# Patient Record
Sex: Female | Born: 1960 | Race: White | Hispanic: Yes | Smoking: Former smoker
Health system: Southern US, Community
[De-identification: ages and names within clinical notes are randomized; demographics above are authoritative.]

## PROBLEM LIST (undated history)

## (undated) DIAGNOSIS — C50919 Malignant neoplasm of unspecified site of unspecified female breast: Secondary | ICD-10-CM

## (undated) DIAGNOSIS — I10 Essential (primary) hypertension: Secondary | ICD-10-CM

## (undated) DIAGNOSIS — K219 Gastro-esophageal reflux disease without esophagitis: Secondary | ICD-10-CM

## (undated) DIAGNOSIS — E119 Type 2 diabetes mellitus without complications: Secondary | ICD-10-CM

## (undated) DIAGNOSIS — R32 Unspecified urinary incontinence: Secondary | ICD-10-CM

## (undated) HISTORY — DX: Gastro-esophageal reflux disease without esophagitis: K21.9

## (undated) HISTORY — PX: EYE SURGERY: SHX253

## (undated) HISTORY — DX: Unspecified urinary incontinence: R32

## (undated) HISTORY — PX: TUBAL LIGATION: SHX77

---

## 2016-01-06 ENCOUNTER — Ambulatory Visit: Payer: Self-pay | Admitting: Family Medicine

## 2016-02-07 ENCOUNTER — Ambulatory Visit (INDEPENDENT_AMBULATORY_CARE_PROVIDER_SITE_OTHER): Payer: BLUE CROSS/BLUE SHIELD | Admitting: Family Medicine

## 2016-02-07 ENCOUNTER — Encounter: Payer: Self-pay | Admitting: Family Medicine

## 2016-02-07 VITALS — BP 137/80 | HR 75 | Temp 98.8°F | Resp 16 | Ht 64.3 in | Wt 218.0 lb

## 2016-02-07 DIAGNOSIS — N950 Postmenopausal bleeding: Secondary | ICD-10-CM | POA: Insufficient documentation

## 2016-02-07 DIAGNOSIS — R32 Unspecified urinary incontinence: Secondary | ICD-10-CM | POA: Diagnosis not present

## 2016-02-07 DIAGNOSIS — Z1211 Encounter for screening for malignant neoplasm of colon: Secondary | ICD-10-CM | POA: Diagnosis not present

## 2016-02-07 DIAGNOSIS — Z1239 Encounter for other screening for malignant neoplasm of breast: Secondary | ICD-10-CM | POA: Diagnosis not present

## 2016-02-07 DIAGNOSIS — K219 Gastro-esophageal reflux disease without esophagitis: Secondary | ICD-10-CM | POA: Diagnosis not present

## 2016-02-07 DIAGNOSIS — Z0000 Encounter for general adult medical examination without abnormal findings: Secondary | ICD-10-CM | POA: Diagnosis not present

## 2016-02-07 LAB — FSH/LH
FSH: 67.8 m[IU]/mL
LH: 30.1 m[IU]/mL

## 2016-02-07 LAB — TSH: TSH: 1.48 mIU/L

## 2016-02-07 MED ORDER — OMEPRAZOLE 40 MG PO CPDR: 40.0000 mg | DELAYED_RELEASE_CAPSULE | Freq: Every day | ORAL | Status: DC

## 2016-02-07 NOTE — Progress Notes (Signed)
Subjective:    Patient ID: Angela Quinn, female    DOB: [birth year], 55 y.o.   MRN: [id number]  HPI: Angela Quinn is a 55 y.o. female presenting on 02/07/2016 for Establish Care   HPI  Pt presents to establish care and for wellness visit today. Previous care provider was [facility]:[address]   It has been years since4  Her last PCP visit. Records from previous provider will be requested and reviewed. Current medical problems include:  GERD: Takes omeprazole to control symptoms.  Irregular menstrual periods: Thought she was post-menopausal and it started again. Periods stopped about 1 year in 2014, restarted in 2015- stopped in September 2015 and had a cycle on Dec 16, 2015. Cycle lasted 6-8 days. Normal cycle for her. Small clots, no heavy bleeding. Was worked up by previous MD. She had a few tests- with no answer. No abdominal fullness. No vagainal bleeding inbetween. Mild hot flashes.  Stress incontinence: Leaks when she sneezes, coughes. Wears a poise pad. Drinks lots of water. Had 4 previous vaginal deliveries. No prolapse. Only in town on Friday.   Health maintenance:  Last pap smear 4 years ago.  Works as a Air traffic controller.  Colonoscopy: Does not want. Would like to do cologuard.  Needs mammograms-    Past Medical History  Diagnosis Date  . GERD (gastroesophageal reflux disease)   . Urine incontinence    Social History   Social History  . Marital Status: Divorced    Spouse Name: N/A  . Number of Children: N/A  . Years of Education: N/A   Occupational History  . Not on file.   Social History Main Topics  . Smoking status: Former Smoker -- 1.00 packs/day for 20 years    Types: Cigarettes  . Smokeless tobacco: Never Used  . Alcohol Use: No  . Drug Use: No  . Sexual Activity: Not on file   Other Topics Concern  . Not on file   Social History Narrative  . No narrative on file   Family History  Problem Relation Age of Onset  .  Hypertension Mother   . Diabetes Mother   . Arthritis Mother   . Cancer Father     brain tumor   No current outpatient prescriptions on file prior to visit.   No current facility-administered medications on file prior to visit.    Review of Systems  Constitutional: Negative for fever and chills.  HENT: Negative.   Respiratory: Negative for cough, chest tightness and wheezing.   Cardiovascular: Negative for chest pain and leg swelling.  Gastrointestinal: Negative for nausea, vomiting, abdominal pain, diarrhea and constipation.  Endocrine: Negative.  Negative for cold intolerance, heat intolerance, polydipsia, polyphagia and polyuria.  Genitourinary: Negative for dysuria and difficulty urinating.  Musculoskeletal: Negative.   Neurological: Negative for dizziness, light-headedness and numbness.  Psychiatric/Behavioral: Negative.    Per HPI unless specifically indicated above     Objective:    BP 137/80 mmHg  Pulse 75  Temp(Src) 98.8 F (37.1 C) (Oral)  Resp 16  Ht 5' 4.3" (1.633 m)  Wt 218 lb (98.884 kg)  BMI 37.08 kg/m2  Wt Readings from Last 3 Encounters:  02/07/16 218 lb (98.884 kg)    Physical Exam  Constitutional: She is oriented to person, place, and time. She appears well-developed and well-nourished.  HENT:  Head: Normocephalic and atraumatic.  Neck: Neck supple.  Cardiovascular: Normal rate, regular rhythm and normal heart sounds.  Exam reveals no gallop and  no friction rub.   No murmur heard. Pulmonary/Chest: Effort normal and breath sounds normal. She has no wheezes. She exhibits no tenderness. Right breast exhibits no inverted nipple, no mass, no nipple discharge, no skin change and no tenderness. Left breast exhibits no inverted nipple, no mass, no nipple discharge, no skin change and no tenderness. Breasts are symmetrical.  Abdominal: Soft. Normal appearance and bowel sounds are normal. She exhibits no distension and no mass. There is no tenderness. There is  no rebound and no guarding.  Genitourinary: No breast swelling, tenderness, discharge or bleeding.  Musculoskeletal: Normal range of motion. She exhibits no edema or tenderness.  Lymphadenopathy:    She has no cervical adenopathy.  Neurological: She is alert and oriented to person, place, and time.  Skin: Skin is warm and dry.   No results found for this or any previous visit.    Assessment & Plan:   Problem List Items Addressed This Visit      Digestive   GERD (gastroesophageal reflux disease)    Renewed omeprazole.       Relevant Medications   omeprazole (PRILOSEC OTC) 20 MG tablet   omeprazole (PRILOSEC) 40 MG capsule   Other Relevant Orders   COMPLETE METABOLIC PANEL WITH GFR     Other   Urine incontinence    Stress incontinence. Kegel exercise and refer to pelvic floor PT.       Relevant Orders   Ambulatory referral to Physical Therapy   Post-menopausal bleeding    Refer to GYN for work-up and possible endometrial biopsy. Check FSH/LH to confirm menopause. Check TSH.      Relevant Orders   FSH/LH   TSH   Ambulatory referral to Obstetrics / Gynecology    Other Visit Diagnoses    Preventative health care    -  Primary    Relevant Orders    VITAMIN D 25 Hydroxy (Vit-D Deficiency, Fractures)    Lipid Profile    Breast cancer screening        Screening for colon cancer           Meds ordered this encounter  Medications  . omeprazole (PRILOSEC OTC) 20 MG tablet    Sig: Take 20 mg by mouth daily.  Marland Kitchen omeprazole (PRILOSEC) 40 MG capsule    Sig: Take 1 capsule (40 mg total) by mouth daily.    Dispense:  90 capsule    Refill:  3    Order Specific Question:  Supervising Provider    Answer:  Janeann Forehand [[id number]]      Follow up plan: Return in about 2 months (around 04/08/2016) for weight. Marland Kitchen

## 2016-02-07 NOTE — Patient Instructions (Signed)
We will check some lab work to determine why you are gaining weight.   We will have you see an GYN for the irregular bleeding.   I will placed a referral to the pelvic floor physical therapist to help with the stress incontinence.

## 2016-02-07 NOTE — Assessment & Plan Note (Addendum)
Refer to GYN for work-up and possible endometrial biopsy. Check FSH/LH to confirm menopause. Check TSH.

## 2016-02-07 NOTE — Assessment & Plan Note (Signed)
Stress incontinence. Kegel exercise and refer to pelvic floor PT.

## 2016-02-07 NOTE — Assessment & Plan Note (Signed)
Renewed omeprazole

## 2016-02-08 LAB — COMPLETE METABOLIC PANEL WITH GFR
ALBUMIN: 4.1 g/dL (ref 3.6–5.1)
ALK PHOS: 106 U/L (ref 33–130)
ALT: 18 U/L (ref 6–29)
AST: 23 U/L (ref 10–35)
BILIRUBIN TOTAL: 0.4 mg/dL (ref 0.2–1.2)
BUN: 19 mg/dL (ref 7–25)
CALCIUM: 9.7 mg/dL (ref 8.6–10.4)
CO2: 20 mmol/L (ref 20–31)
Chloride: 107 mmol/L (ref 98–110)
Creat: 1.01 mg/dL (ref 0.50–1.05)
GFR, EST NON AFRICAN AMERICAN: 63 mL/min (ref >=60)
GFR, Est African American: 72 mL/min (ref >=60)
GLUCOSE: 90 mg/dL (ref 65–99)
Potassium: 3.9 mmol/L (ref 3.5–5.3)
SODIUM: 141 mmol/L (ref 135–146)
TOTAL PROTEIN: 6.9 g/dL (ref 6.1–8.1)

## 2016-02-08 LAB — LIPID PANEL
Cholesterol: 159 mg/dL (ref 125–200)
HDL: 35 mg/dL — AB (ref >=46)
LDL Cholesterol: 101 mg/dL (ref <130)
Total CHOL/HDL Ratio: 4.5 Ratio (ref <=5.0)
Triglycerides: 116 mg/dL (ref <150)
VLDL: 23 mg/dL (ref <30)

## 2016-02-08 LAB — VITAMIN D 25 HYDROXY (VIT D DEFICIENCY, FRACTURES): Vit D, 25-Hydroxy: 24 ng/mL — ABNORMAL LOW (ref 30–100)

## 2016-02-11 ENCOUNTER — Telehealth: Payer: Self-pay | Admitting: Family Medicine

## 2016-02-11 ENCOUNTER — Encounter: Payer: Self-pay | Admitting: *Deleted

## 2016-02-11 NOTE — Telephone Encounter (Signed)
Appt letter will be mailed to patient. Not able to contact as # d/c.

## 2016-02-11 NOTE — Telephone Encounter (Signed)
Tonya at [facility] said pt's appt is scheduled for July 21st at 3:10, arrive 15 minutes prior.

## 2016-02-25 ENCOUNTER — Telehealth: Payer: Self-pay | Admitting: Family Medicine

## 2016-02-25 MED ORDER — VITAMIN D (ERGOCALCIFEROL) 1.25 MG (50000 UNIT) PO CAPS
50000.0000 [IU] | ORAL_CAPSULE | ORAL | Status: DC
Start: 2016-02-25 — End: 2016-07-29

## 2016-02-25 NOTE — Telephone Encounter (Signed)
Sent to pharmacy on file. Please alert patient.

## 2016-02-25 NOTE — Telephone Encounter (Signed)
As per pt Angela Quinn want her to start Rx strength vitamin D but she called pharmacy but they don't have any thing and Idon't see in her med list please suggest if you want me to send and direction.

## 2016-02-25 NOTE — Telephone Encounter (Signed)
Pt advised.

## 2016-03-06 DIAGNOSIS — N950 Postmenopausal bleeding: Secondary | ICD-10-CM | POA: Diagnosis not present

## 2016-03-17 DIAGNOSIS — S12400A Unspecified displaced fracture of fifth cervical vertebra, initial encounter for closed fracture: Secondary | ICD-10-CM | POA: Diagnosis not present

## 2016-03-17 DIAGNOSIS — G44209 Tension-type headache, unspecified, not intractable: Secondary | ICD-10-CM | POA: Diagnosis not present

## 2016-03-17 DIAGNOSIS — S129XXA Fracture of neck, unspecified, initial encounter: Secondary | ICD-10-CM | POA: Diagnosis not present

## 2016-03-17 DIAGNOSIS — S12500A Unspecified displaced fracture of sixth cervical vertebra, initial encounter for closed fracture: Secondary | ICD-10-CM | POA: Diagnosis not present

## 2016-03-17 DIAGNOSIS — M50322 Other cervical disc degeneration at C5-C6 level: Secondary | ICD-10-CM | POA: Diagnosis not present

## 2016-03-17 DIAGNOSIS — M47812 Spondylosis without myelopathy or radiculopathy, cervical region: Secondary | ICD-10-CM | POA: Diagnosis not present

## 2016-03-17 DIAGNOSIS — M50221 Other cervical disc displacement at C4-C5 level: Secondary | ICD-10-CM | POA: Diagnosis not present

## 2016-03-17 DIAGNOSIS — S161XXA Strain of muscle, fascia and tendon at neck level, initial encounter: Secondary | ICD-10-CM | POA: Diagnosis not present

## 2016-03-17 DIAGNOSIS — M5030 Other cervical disc degeneration, unspecified cervical region: Secondary | ICD-10-CM | POA: Diagnosis not present

## 2016-03-17 DIAGNOSIS — M436 Torticollis: Secondary | ICD-10-CM | POA: Diagnosis not present

## 2016-03-17 DIAGNOSIS — R51 Headache: Secondary | ICD-10-CM | POA: Diagnosis not present

## 2016-03-18 DIAGNOSIS — R9431 Abnormal electrocardiogram [ECG] [EKG]: Secondary | ICD-10-CM | POA: Diagnosis not present

## 2016-03-23 ENCOUNTER — Encounter: Payer: Self-pay | Admitting: Family Medicine

## 2016-03-23 ENCOUNTER — Ambulatory Visit (INDEPENDENT_AMBULATORY_CARE_PROVIDER_SITE_OTHER): Payer: BLUE CROSS/BLUE SHIELD | Admitting: Family Medicine

## 2016-03-23 VITALS — BP 142/89 | HR 72 | Temp 97.7°F | Resp 16 | Ht 64.3 in | Wt 220.0 lb

## 2016-03-23 DIAGNOSIS — D72829 Elevated white blood cell count, unspecified: Secondary | ICD-10-CM | POA: Diagnosis not present

## 2016-03-23 DIAGNOSIS — M5030 Other cervical disc degeneration, unspecified cervical region: Secondary | ICD-10-CM

## 2016-03-23 DIAGNOSIS — S161XXD Strain of muscle, fascia and tendon at neck level, subsequent encounter: Secondary | ICD-10-CM | POA: Diagnosis not present

## 2016-03-23 LAB — CBC WITH DIFFERENTIAL/PLATELET
BASOS PCT: 0 %
Basophils Absolute: 0 cells/uL (ref 0–200)
EOS PCT: 2 %
Eosinophils Absolute: 146 cells/uL (ref 15–500)
HCT: 39.6 % (ref 35.0–45.0)
HEMOGLOBIN: 13.2 g/dL (ref 11.7–15.5)
LYMPHS ABS: 2409 {cells}/uL (ref 850–3900)
Lymphocytes Relative: 33 %
MCH: 27.3 pg (ref 27.0–33.0)
MCHC: 33.3 g/dL (ref 32.0–36.0)
MCV: 82 fL (ref 80.0–100.0)
MPV: 10 fL (ref 7.5–12.5)
Monocytes Absolute: 584 cells/uL (ref 200–950)
Monocytes Relative: 8 %
NEUTROS ABS: 4161 {cells}/uL (ref 1500–7800)
Neutrophils Relative %: 57 %
Platelets: 327 10*3/uL (ref 140–400)
RBC: 4.83 MIL/uL (ref 3.80–5.10)
RDW: 14.4 % (ref 11.0–15.0)
WBC: 7.3 10*3/uL (ref 3.8–10.8)

## 2016-03-23 MED ORDER — OXYCODONE-ACETAMINOPHEN 5-325 MG PO TABS: 1.0000 | ORAL_TABLET | Freq: Four times a day (QID) | ORAL | 0 refills | Status: DC | PRN

## 2016-03-23 MED ORDER — CYCLOBENZAPRINE HCL 10 MG PO TABS: 10.0000 mg | ORAL_TABLET | Freq: Three times a day (TID) | ORAL | 0 refills | Status: DC | PRN

## 2016-03-23 MED ORDER — KETOROLAC TROMETHAMINE 60 MG/2ML IM SOLN
60.0000 mg | Freq: Once | INTRAMUSCULAR | Status: AC
  Administered 2016-03-23: 60 mg via INTRAMUSCULAR

## 2016-03-23 MED ORDER — KETOROLAC TROMETHAMINE 10 MG PO TABS: 10.0000 mg | ORAL_TABLET | Freq: Four times a day (QID) | ORAL | 0 refills | Status: DC | PRN

## 2016-03-23 MED ORDER — NAPROXEN 500 MG PO TABS: 500.0000 mg | ORAL_TABLET | Freq: Two times a day (BID) | ORAL | 1 refills | Status: DC

## 2016-03-23 NOTE — Patient Instructions (Addendum)
We will try toradol today to help with your pain. Take 1 tab every 6 hours as needed for pain.  You can also use flexeril for pain. Take up to 3 times daily.  Please set up PT today to help with neck pain.   We will set up an appt with Neurosurgery to discuss the abnormal findings on your CT and MRI.   Please seek immediate medical attention at ER or Urgent Care if you develop: Worsening HA. Extreme sensitivity to light.  Any neurological changes- including feeling lethargic or sleepy. Chest pain, pressure or tightness. Shortness of breath accompanied by nausea or diaphoresis Visual changes Numbness or tingling on one side of the body Facial droop Altered mental status Or any concerning symptoms.
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
## 2016-03-24 LAB — BASIC METABOLIC PANEL WITH GFR
BUN: 12 mg/dL (ref 7–25)
CHLORIDE: 107 mmol/L (ref 98–110)
CO2: 22 mmol/L (ref 20–31)
CREATININE: 0.7 mg/dL (ref 0.50–1.05)
Calcium: 9.3 mg/dL (ref 8.6–10.4)
GFR, Est African American: >89 mL/min (ref >=60)
Glucose, Bld: 100 mg/dL — ABNORMAL HIGH (ref 65–99)
POTASSIUM: 4.2 mmol/L (ref 3.5–5.3)
SODIUM: 141 mmol/L (ref 135–146)

## 2016-04-01 ENCOUNTER — Ambulatory Visit (INDEPENDENT_AMBULATORY_CARE_PROVIDER_SITE_OTHER): Payer: BLUE CROSS/BLUE SHIELD | Admitting: Family Medicine

## 2016-04-01 VITALS — BP 135/84 | HR 72 | Temp 97.7°F | Resp 16 | Ht 64.3 in | Wt 221.0 lb

## 2016-04-01 DIAGNOSIS — R937 Abnormal findings on diagnostic imaging of other parts of musculoskeletal system: Secondary | ICD-10-CM | POA: Diagnosis not present

## 2016-04-01 DIAGNOSIS — G4762 Sleep related leg cramps: Secondary | ICD-10-CM

## 2016-04-01 DIAGNOSIS — B353 Tinea pedis: Secondary | ICD-10-CM | POA: Diagnosis not present

## 2016-04-01 DIAGNOSIS — S161XXD Strain of muscle, fascia and tendon at neck level, subsequent encounter: Secondary | ICD-10-CM | POA: Diagnosis not present

## 2016-04-01 MED ORDER — TERBINAFINE HCL 250 MG PO TABS: 250.0000 mg | ORAL_TABLET | Freq: Every day | ORAL | 0 refills | Status: DC

## 2016-04-01 MED ORDER — MAGNESIUM OXIDE 250 MG PO TABS: 1.0000 | ORAL_TABLET | Freq: Every day | ORAL | 0 refills | Status: DC

## 2016-04-01 MED ORDER — CYCLOBENZAPRINE HCL 10 MG PO TABS: 10.0000 mg | ORAL_TABLET | Freq: Three times a day (TID) | ORAL | 0 refills | Status: DC | PRN

## 2016-04-01 NOTE — Assessment & Plan Note (Signed)
Will continue with the plan to have see neurosurgery to determine if the abnormalities on MRI are congenital defects or result of old injury.

## 2016-04-01 NOTE — Patient Instructions (Addendum)
You are cleared to drive. Do take flexeril within 8 hours of driving since it can make you sleepy. Continue muscle stretches as need need. You should use heat as needed for stiff muscles.   Athlete's Foot Athlete's foot (tinea pedis) is a fungal infection of the skin on the feet. It often occurs on the skin between the toes or underneath the toes. It can also occur on the soles of the feet. Athlete's foot is more likely to occur in hot, humid weather. Not washing your feet or changing your socks often enough can contribute to athlete's foot. The infection can spread from person to person (contagious). CAUSES Athlete's foot is caused by a fungus. This fungus thrives in warm, moist places. Most people get athlete's foot by sharing shower stalls, towels, and wet floors with an infected person. People with weakened immune systems, including those with diabetes, may be more likely to get athlete's foot. SYMPTOMS   Itchy areas between the toes or on the soles of the feet.  White, flaky, or scaly areas between the toes or on the soles of the feet.  Tiny, intensely itchy blisters between the toes or on the soles of the feet.  Tiny cuts on the skin. These cuts can develop a bacterial infection.  Thick or discolored toenails. DIAGNOSIS  Your caregiver can usually tell what the problem is by doing a physical exam. Your caregiver may also take a skin sample from the rash area. The skin sample may be examined under a microscope, or it may be tested to see if fungus will grow in the sample. A sample may also be taken from your toenail for testing. TREATMENT  Over-the-counter and prescription medicines can be used to kill the fungus. These medicines are available as powders or creams. Your caregiver can suggest medicines for you. Fungal infections respond slowly to treatment. You may need to continue using your medicine for several weeks. PREVENTION   Do not share towels.  Wear sandals in wet areas, such  as shared locker rooms and shared showers.  Keep your feet dry. Wear shoes that allow air to circulate. Wear cotton or wool socks. HOME CARE INSTRUCTIONS   Take medicines as directed by your caregiver. Do not use steroid creams on athlete's foot.  Keep your feet clean and cool. Wash your feet daily and dry them thoroughly, especially between your toes.  Change your socks every day. Wear cotton or wool socks. In hot climates, you may need to change your socks 2 to 3 times per day.  Wear sandals or canvas tennis shoes with good air circulation.  If you have blisters, soak your feet in Burow's solution or Epsom salts for 20 to 30 minutes, 2 times a day to dry out the blisters. Make sure you dry your feet thoroughly afterward. SEEK MEDICAL CARE IF:   You have a fever.  You have swelling, soreness, warmth, or redness in your foot.  You are not getting better after 7 days of treatment.  You are not completely cured after 30 days.  You have any problems caused by your medicines. MAKE SURE YOU:   Understand these instructions.  Will watch your condition.  Will get help right away if you are not doing well or get worse.   This information is not intended to replace advice given to you by your health care provider. Make sure you discuss any questions you have with your health care provider.   Document Released: 07/31/2000 Document Revised: 10/26/2011 Document  Reviewed: 02/04/2015 Elsevier Interactive Patient Education [employer].

## 2016-04-01 NOTE — Progress Notes (Signed)
Subjective:    Patient ID: Sheila Oats, female    DOB: [birth year], 55 y.o.   MRN: [id number]  HPI: Madalena Kesecker is a 55 y.o. female presenting on 04/01/2016 for Neck Pain (improved )   HPI  Pt presents for follow-up of neck injury. Doing much better. Able to move. Would like to go back to work. She needs a note to go back to work. Has more range of motion in the neck.  Pain is minimal.  Worried about athlete's foot. Has tried OTC cream for several weeks- not going away. She thinks she got it a truck stop.  Legs cramps at night have been made better by flexeril.    Past Medical History:  Diagnosis Date  . GERD (gastroesophageal reflux disease)   . Urine incontinence     Current Outpatient Prescriptions on File Prior to Visit  Medication Sig  . naproxen (NAPROSYN) 500 MG tablet Take 1 tablet (500 mg total) by mouth 2 (two) times daily with a meal.  . omeprazole (PRILOSEC) 40 MG capsule Take 1 capsule (40 mg total) by mouth daily.  . Vitamin D, Ergocalciferol, (DRISDOL) 50000 units CAPS capsule Take 1 capsule (50,000 Units total) by mouth every 7 (seven) days.   No current facility-administered medications on file prior to visit.     Review of Systems  Constitutional: Negative for chills and fever.  HENT: Negative.   Respiratory: Negative for cough, chest tightness and wheezing.   Cardiovascular: Negative for chest pain and leg swelling.  Gastrointestinal: Negative for abdominal pain, constipation, diarrhea, nausea and vomiting.  Endocrine: Negative.  Negative for cold intolerance, heat intolerance, polydipsia, polyphagia and polyuria.  Genitourinary: Negative for difficulty urinating and dysuria.  Musculoskeletal: Positive for myalgias and neck stiffness (much improved. ).  Skin: Positive for rash.  Neurological: Negative for dizziness, light-headedness and numbness.  Psychiatric/Behavioral: Negative.    Per HPI unless specifically indicated above     Objective:    BP  135/84 (BP Location: Left Arm, Patient Position: Sitting, Cuff Size: Normal)   Pulse 72   Temp 97.7 F (36.5 C) (Oral)   Resp 16   Ht 5' 4.3" (1.633 m)   Wt 221 lb (100.2 kg)   BMI 37.58 kg/m   Wt Readings from Last 3 Encounters:  04/01/16 221 lb (100.2 kg)  03/23/16 220 lb (99.8 kg)  02/07/16 218 lb (98.9 kg)    Physical Exam  Constitutional: She appears well-developed and well-nourished. No distress.  HENT:  Head: Normocephalic and atraumatic.  Neck: Normal range of motion. Neck supple. No spinous process tenderness and no muscular tenderness present. No neck rigidity. Normal range of motion present. No Brudzinski's sign and no Kernig's sign noted.  Cardiovascular: Normal rate and regular rhythm.  Exam reveals no gallop and no friction rub.   No murmur heard. Pulmonary/Chest: Effort normal and breath sounds normal. She has no wheezes. She exhibits no tenderness.  Skin: Rash noted. She is not diaphoretic.  Red scaling rash between the toes on the R foot.    Results for orders placed or performed in visit on 03/23/16  CBC with Differential  Result Value Ref Range   WBC 7.3 3.8 - 10.8 K/uL   RBC 4.83 3.80 - 5.10 MIL/uL   Hemoglobin 13.2 11.7 - 15.5 g/dL   HCT 39.6 35.0 - 45.0 %   MCV 82.0 80.0 - 100.0 fL   MCH 27.3 27.0 - 33.0 pg   MCHC 33.3 32.0 - 36.0 g/dL  RDW 14.4 11.0 - 15.0 %   Platelets 327 140 - 400 K/uL   MPV 10.0 7.5 - 12.5 fL   Neutro Abs 4,161 1,500 - 7,800 cells/uL   Lymphs Abs 2,409 850 - 3,900 cells/uL   Monocytes Absolute 584 200 - 950 cells/uL   Eosinophils Absolute 146 15 - 500 cells/uL   Basophils Absolute 0 0 - 200 cells/uL   Neutrophils Relative % 57 %   Lymphocytes Relative 33 %   Monocytes Relative 8 %   Eosinophils Relative 2 %   Basophils Relative 0 %   Smear Review Criteria for review not met   BASIC METABOLIC PANEL WITH GFR  Result Value Ref Range   Sodium 141 135 - 146 mmol/L   Potassium 4.2 3.5 - 5.3 mmol/L   Chloride 107 98 - 110  mmol/L   CO2 22 20 - 31 mmol/L   Glucose, Bld 100 (H) 65 - 99 mg/dL   BUN 12 7 - 25 mg/dL   Creat 0.70 0.50 - 1.05 mg/dL   Calcium 9.3 8.6 - 10.4 mg/dL   GFR, Est African American >89 >=60 mL/min   GFR, Est Non African American >89 >=60 mL/min      Assessment & Plan:   Problem List Items Addressed This Visit      Other   Abnormal MRI, cervical spine    Will continue with the plan to have see neurosurgery to determine if the abnormalities on MRI are congenital defects or result of old injury.        Other Visit Diagnoses    Cervical strain, acute, subsequent encounter    -  Primary   Cervical strain on 7/31- seen in ER 8/2. Treat with toradol and flexeril. Light duty- no driving. PT recommeded. Alarm symptoms reviewed. Recheck 2 weeks.    Relevant Medications   cyclobenzaprine (FLEXERIL) 10 MG tablet   Tinea pedis of right foot       Trial of oral terbinafine since failing OTC treatment.    Relevant Medications   terbinafine (LAMISIL) 250 MG tablet   Nocturnal leg cramps       Improved with flexeril. Encouraged pt to take PRN. Also try magnesium OTC.    Relevant Medications   Magnesium Oxide 250 MG TABS      Meds ordered this encounter  Medications  . terbinafine (LAMISIL) 250 MG tablet    Sig: Take 1 tablet (250 mg total) by mouth daily.    Dispense:  14 tablet    Refill:  0    Order Specific Question:   Supervising Provider    Answer:   Arlis Porta [phone]  . cyclobenzaprine (FLEXERIL) 10 MG tablet    Sig: Take 1 tablet (10 mg total) by mouth 3 (three) times daily as needed for muscle spasms.    Dispense:  30 tablet    Refill:  0    Order Specific Question:   Supervising Provider    Answer:   Arlis Porta [phone]  . Magnesium Oxide 250 MG TABS    Sig: Take 1 tablet (250 mg total) by mouth at bedtime.    Refill:  0    Order Specific Question:   Supervising Provider    Answer:   Arlis Porta [[id number]]      Follow up plan: Return if  symptoms worsen or fail to improve.

## 2016-04-06 ENCOUNTER — Ambulatory Visit: Payer: BLUE CROSS/BLUE SHIELD | Admitting: Family Medicine

## 2016-04-10 ENCOUNTER — Ambulatory Visit: Payer: BLUE CROSS/BLUE SHIELD | Admitting: Family Medicine

## 2016-04-17 DIAGNOSIS — Z6834 Body mass index (BMI) 34.0-34.9, adult: Secondary | ICD-10-CM | POA: Diagnosis not present

## 2016-04-17 DIAGNOSIS — M542 Cervicalgia: Secondary | ICD-10-CM | POA: Diagnosis not present

## 2016-07-27 ENCOUNTER — Other Ambulatory Visit: Payer: Self-pay | Admitting: Family Medicine

## 2016-07-29 ENCOUNTER — Other Ambulatory Visit: Payer: Self-pay | Admitting: Family Medicine

## 2016-07-29 DIAGNOSIS — E559 Vitamin D deficiency, unspecified: Secondary | ICD-10-CM

## 2016-07-29 MED ORDER — VITAMIN D3 50 MCG (2000 UT) PO CAPS: 2000.0000 [IU] | ORAL_CAPSULE | Freq: Every day | ORAL | Status: DC

## 2016-07-29 NOTE — Telephone Encounter (Signed)
Received refill request Vit D 50,000 units weekly 2 days ago on 12/11, this was declined, and received another refill today. Called patient, discussed her history that she had Vitamin D insufficiency result 23 back in 01/2016, she was treated with Vitamin D 50,000 unit weekly treatment for 12 weeks, and now was trying to get a refill but was unaware of duration of therapy. I advised her that she has completed 12 weeks recommended treatment, now needs daily maintenance dose of Vitamin D3 2,000 unit daily, this is OTC. She will start this and follow-up in future for re-check lab Vitamin D.  Saralyn Pilar, DO [facility] 07/29/2016, 12:18 PM

## 2016-11-17 ENCOUNTER — Other Ambulatory Visit: Payer: Self-pay | Admitting: Nurse Practitioner

## 2016-11-17 DIAGNOSIS — K219 Gastro-esophageal reflux disease without esophagitis: Secondary | ICD-10-CM

## 2016-11-17 MED ORDER — OMEPRAZOLE 40 MG PO CPDR: 40.0000 mg | DELAYED_RELEASE_CAPSULE | Freq: Every day | ORAL | 0 refills | Status: DC

## 2016-11-18 ENCOUNTER — Encounter: Payer: Self-pay | Admitting: Obstetrics & Gynecology

## 2016-11-27 ENCOUNTER — Ambulatory Visit (INDEPENDENT_AMBULATORY_CARE_PROVIDER_SITE_OTHER): Payer: BLUE CROSS/BLUE SHIELD | Admitting: Obstetrics & Gynecology

## 2016-11-27 ENCOUNTER — Encounter: Payer: Self-pay | Admitting: Obstetrics & Gynecology

## 2016-11-27 VITALS — BP 120/80 | HR 86 | Ht 67.0 in | Wt 220.0 lb

## 2016-11-27 DIAGNOSIS — N950 Postmenopausal bleeding: Secondary | ICD-10-CM

## 2016-11-27 DIAGNOSIS — N393 Stress incontinence (female) (male): Secondary | ICD-10-CM | POA: Diagnosis not present

## 2016-11-27 DIAGNOSIS — N840 Polyp of corpus uteri: Secondary | ICD-10-CM | POA: Diagnosis not present

## 2016-11-27 NOTE — Patient Instructions (Signed)
Postmenopausal Bleeding Postmenopausal bleeding is any bleeding a woman has after she has entered into menopause. Menopause is the end of a woman's fertile years. After menopause, a woman no longer ovulates or has menstrual periods. Postmenopausal bleeding can be caused by various things. Any type of postmenopausal bleeding, even if it appears to be a typical menstrual period, is concerning. This should be evaluated by your health care provider. Any treatment will depend on the cause of the bleeding. Follow these instructions at home: Monitor your condition for any changes. The following actions may help to alleviate any discomfort you are experiencing:  Avoid the use of tampons and douches as directed by your health care provider.  Change your pads frequently.  Get regular pelvic exams and Pap tests.  Keep all follow-up appointments for diagnostic tests as directed by your health care provider. Contact a health care provider if:  Your bleeding lasts more than 1 week.  You have abdominal pain.  You have bleeding with sexual intercourse. Get help right away if:  You have a fever, chills, headache, dizziness, muscle aches, and bleeding.  You have severe pain with bleeding.  You are passing blood clots.  You have bleeding and need more than 1 pad an hour.  You feel faint. This information is not intended to replace advice given to you by your health care provider. Make sure you discuss any questions you have with your health care provider. Document Released: 11/11/2005 Document Revised: 01/09/2016 Document Reviewed: 03/02/2013 Elsevier Interactive Patient Education  2017 Elsevier Inc.  

## 2016-11-27 NOTE — Progress Notes (Signed)
Postmenopausal Bleeding Patient complains of vaginal bleeding. She has been menopausal for 1 year. Currently on no HRT. Bleeding is described as spotting for sevearl days (recently thisApril, also last summer, so imtermittant) and has occurred a few times. Other menopausal symptoms include: vasomotor symptoms began 2 years ago, occur a few times per day, and last about a few seconds.. Workup to date: none.  Menstrual History: OB History    Gravida Para Term Preterm AB Living   4 4 4     4    SAB TAB Ectopic Multiple Live Births                  Menarche age: 56 Patient's last menstrual period was 11/15/2016.    Additional Complaints: Pt also describes LOU with cough, laugh, exercise.  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  Wears a pad, still messes up clothes at tiime.  No recent UTI.  No nocturia or frequency.  PMHx: She  has a past medical history of GERD (gastroesophageal reflux disease) and Urine incontinence. Also,  has a past surgical history that includes Tubal ligation., family history includes Arthritis in her mother; Cancer in her father; Diabetes in her mother; Hypertension in her mother.,  reports that she has quit smoking. Her smoking use included Cigarettes. She has a 20.00 pack-year smoking history. She has never used smokeless tobacco. She reports that she does not drink alcohol or use drugs.  She has a current medication list which includes the following prescription(s): omeprazole. Also, is allergic to penicillins.  Review of Systems  Constitutional: Negative for chills, fever and malaise/fatigue.  HENT: Negative for congestion, sinus pain and sore throat.   Eyes: Negative for blurred vision and pain.  Respiratory: Negative for cough and wheezing.   Cardiovascular: Negative for chest pain and leg swelling.  Gastrointestinal: Negative for abdominal pain, constipation, diarrhea, heartburn, nausea and vomiting.  Genitourinary: Negative for  dysuria, frequency, hematuria and urgency.  Musculoskeletal: Negative for back pain, joint pain, myalgias and neck pain.  Skin: Negative for itching and rash.  Neurological: Negative for dizziness, tremors and weakness.  Endo/Heme/Allergies: Does not bruise/bleed easily.  Psychiatric/Behavioral: Negative for depression. The patient is not nervous/anxious and does not have insomnia.     Objective: BP 120/80   Pulse 86   Ht 5\' 7"  (1.702 m)   Wt 220 lb (99.8 kg)   LMP 11/15/2016   BMI 34.46 kg/m  Physical Exam  Constitutional: She is oriented to person, place, and time. She appears well-developed and well-nourished. No distress.  Genitourinary: Vagina normal. Pelvic exam was performed with patient supine. There is no rash, tenderness or lesion on the right labia. There is no rash, tenderness or lesion on the left labia. No erythema or bleeding in the vagina. Right adnexum does not display mass and does not display tenderness. Left adnexum does not display mass and does not display tenderness. Cervix does not exhibit motion tenderness, discharge, polyp or nabothian cyst.   Uterus is enlarged, mobile and anteverted. Uterus is not exhibiting a mass.  Genitourinary Comments: 10 weeks size  Abdominal: Soft. She exhibits no distension. There is no tenderness.  Musculoskeletal: Normal range of motion.  Neurological: She is alert and oriented to person, place, and time. No cranial nerve deficit.  Skin: Skin is warm and dry.  Psychiatric: She has a normal mood and affect.   Endometrial Biopsy After discussion with the patient regarding her abnormal uterine bleeding I recommended that she proceed with  an endometrial biopsy for further diagnosis. The risks, benefits, alternatives, and indications for an endometrial biopsy were discussed with the patient in detail. She understood the risks including infection, bleeding, cervical laceration and uterine perforation.  Verbal consent was obtained.    PROCEDURE NOTE:  Pipelle endometrial biopsy was performed using aseptic technique with iodine preparation.  The uterus was sounded to a length of 7 cm.  Adequate sampling was obtained with minimal blood loss.  The patient tolerated the procedure well.  Disposition will be pending pathology.  ASSESSMENT/PLAN:  Urinary Stress Incontinence, Postmenopausal Bleeding, Enlarged uterus (possible adenomyosis)  Problem List Items Addressed This Visit      Other   Post-menopausal bleeding - Primary   Relevant Orders   Pathology    EMB done  Urinary Incontinence, no sig cystocele, but has enlarged uterus and anteversion into bladder that contribute to incontinence.  Options include hysterectomy, pessary, bladder sling, meds for DI although not the likely dx, and expectant management. Pros and cons of surgery discussed.  Pt request hysterectomy for urinary sx's as well as PMB.  Will call with EMB results and decide on tx at that time.  Info on hysterectomy given.  Annamarie Major, MD, [facility], [facility] 11/27/2016  10:16 AM

## 2016-12-01 LAB — PATHOLOGY

## 2016-12-07 NOTE — Progress Notes (Signed)
LM, benign EMB, consideration for tx options of enlarged uterus, bleeding, and bladder pressure/incontinence.  Await call back.

## 2016-12-25 NOTE — Progress Notes (Signed)
lmtrc

## 2016-12-25 NOTE — Progress Notes (Signed)
I have tried and cannot reach patient to discuss normal biopsy results and to see how bleeding and other symptoms are doing.  Please try to reach out to patient and see if she is OK or needs to discuss further her symptoms or options for treatment.  Thx.

## 2016-12-28 NOTE — Progress Notes (Signed)
pt's phone number is no longer in service.

## 2017-02-15 ENCOUNTER — Other Ambulatory Visit: Payer: Self-pay | Admitting: Nurse Practitioner

## 2017-02-15 DIAGNOSIS — K219 Gastro-esophageal reflux disease without esophagitis: Secondary | ICD-10-CM

## 2017-03-31 ENCOUNTER — Other Ambulatory Visit: Payer: Self-pay | Admitting: Nurse Practitioner

## 2017-03-31 DIAGNOSIS — K219 Gastro-esophageal reflux disease without esophagitis: Secondary | ICD-10-CM

## 2017-04-06 ENCOUNTER — Encounter: Payer: Self-pay | Admitting: Nurse Practitioner

## 2017-04-06 ENCOUNTER — Ambulatory Visit (INDEPENDENT_AMBULATORY_CARE_PROVIDER_SITE_OTHER): Payer: BLUE CROSS/BLUE SHIELD | Admitting: Nurse Practitioner

## 2017-04-06 VITALS — BP 116/88 | HR 74 | Temp 97.8°F | Ht 67.0 in | Wt 210.6 lb

## 2017-04-06 DIAGNOSIS — L237 Allergic contact dermatitis due to plants, except food: Secondary | ICD-10-CM

## 2017-04-06 DIAGNOSIS — K219 Gastro-esophageal reflux disease without esophagitis: Secondary | ICD-10-CM

## 2017-04-06 MED ORDER — RANITIDINE HCL 150 MG PO CAPS: 150.0000 mg | ORAL_CAPSULE | Freq: Two times a day (BID) | ORAL | 5 refills | Status: DC | PRN

## 2017-04-06 MED ORDER — PREDNISONE 10 MG PO TABS: ORAL_TABLET | ORAL | 0 refills | Status: DC

## 2017-04-06 NOTE — Progress Notes (Signed)
Subjective:    Patient ID: Angela Quinn, female    DOB: 02/11/1961, 56 y.o.   MRN: [id number]  Angela Quinn is a 56 y.o. female presenting on 04/06/2017 for Rash   HPI Rash - Poison Oak exposure Initial exposure was last Monday 8 days ago w/ rash appearing Wednesday (6 days ago).  Pt notes rash continues to get worse w/o new eruptions.  Rash is located on right wrist, neck, chest, abdomen, left leg. Pt has been using calamine lotion, "poison oak/ivy scrub"  Acid Reflux Omeprazole 40 mg once daily - Takes 2-3 x per week.  Was needing to double up on 20 mg tablets before, but has been able to cut back.Has lost 10 lbs.  Cooking out of a crock pot, avoiding carbs and is having fewer symptoms.  Social History  Substance Use Topics  . Smoking status: Former Smoker    Packs/day: 1.00    Years: 20.00    Types: Cigarettes  . Smokeless tobacco: Never Used  . Alcohol use No    Review of Systems Per HPI unless specifically indicated above     Objective:    BP 116/88 (BP Location: Right Arm, Patient Position: Sitting, Cuff Size: Normal)   Pulse 74   Temp 97.8 F (36.6 C) (Oral)   Ht 5\' 7"  (1.702 m)   Wt 210 lb 9.6 oz (95.5 kg)   BMI 32.98 kg/m   Wt Readings from Last 3 Encounters:  04/06/17 210 lb 9.6 oz (95.5 kg)  11/27/16 220 lb (99.8 kg)  04/01/16 221 lb (100.2 kg)    Physical Exam  Constitutional: She is oriented to person, place, and time. She appears well-developed and well-nourished. No distress.  HENT:  Head: Normocephalic and atraumatic.  Cardiovascular: Normal rate, regular rhythm and normal heart sounds.   Pulmonary/Chest: Effort normal and breath sounds normal. No respiratory distress.  Abdominal: Soft. Bowel sounds are normal. She exhibits no distension and no mass.  Neurological: She is alert and oriented to person, place, and time.  Skin: Skin is warm. Rash noted.     Psychiatric: She has a normal mood and affect. Her behavior is normal. Judgment and thought  content normal.  Vitals reviewed.      Assessment & Plan:   Problem List Items Addressed This Visit      Digestive   GERD (gastroesophageal reflux disease)    Symptoms improving. Pt only taking PPI intermittently.  Plan: 1. Discontinue pantoprazole.  2. Start ranitidine 150 mg twice daily as needed for heartburn.   3. Recommend continued focus on diet for weight loss to improve symptoms.  Avoid triggers. 4. Follow up as needed.      Relevant Medications   ranitidine (ZANTAC) 150 MG capsule    Other Visit Diagnoses    Contact dermatitis due to poison oak    -  Primary Consistent with poison ivy contact dermatitis, x 8 day duration. Patient with known h/o poison ivy, similar with previous exposures - Afebrile, no evidence of bacterial superinfection from scratching  Plan: 1. Prednisone 20mg  tabs taper over 14 days Day 1-4: 60 mg, Day 5-8: 40 mg, Day 9-12: 20 mg; Day 13-14 10 mg then stop. 2. Start OTC Benadryl PRN itching, may try topical calamine lotion and hydrocortisone cream. 3. Avoid scratching 4. Return criteria given, 2 weeks if worsening or not improving       Meds ordered this encounter  Medications  . ranitidine (ZANTAC) 150 MG capsule    Sig: Take  1 capsule (150 mg total) by mouth 2 (two) times daily as needed for heartburn.    Dispense:  60 capsule    Refill:  5    Order Specific Question:   Supervising Provider    Answer:   Smitty Cords [[id number]]  . predniSONE (DELTASONE) 10 MG tablet    Sig: Day 1-4 take 3 pills. Day 5-8 take 2 pills. Day 9-12 take 1 pill.  Day 13-14 take 1/2 pill.    Dispense:  25 tablet    Refill:  0    Please cancel previous prescription - incorrect sig    Order Specific Question:   Supervising Provider    Answer:   Smitty Cords [[id number]]    Follow up plan: Return in about 2 months (around 06/06/2017) for annual physical.   Wilhelmina Mcardle, DNP, AGPCNP-BC Adult Gerontology Primary Care Nurse Practitioner [facility] 04/08/2017, 5:01 PM

## 2017-04-06 NOTE — Patient Instructions (Addendum)
Angela Quinn, Thank you for coming in to clinic today.  1.For your poison oak: Take prednisone taper 14 days: - Day 1-4 take 3 pills.  - Day 5-8 take 2 pills.  - Day 9-12 take 1 pill.   - Day 13-14 take 1/2 pill.  - Continue calamine and hydrocortisone cream  2. For your GERD: - Take Zantac (ranitidine) 150mg  twice daily as needed  Please schedule a follow-up appointment with Wilhelmina Mcardle, AGNP. Return in about 2 months (around 06/06/2017) for annual physical.  If you have any other questions or concerns, please feel free to call the clinic or send a message through MyChart. You may also schedule an earlier appointment if necessary.  You will receive a survey after today's visit either digitally by e-mail or paper by [employer]. Your experiences and feedback matter to [place].  Please respond so we know how we are doing as we provide care for you.   Wilhelmina Mcardle, DNP, AGNP-BC Adult Gerontology Nurse Practitioner [facility], [facility]

## 2017-04-08 NOTE — Progress Notes (Signed)
I have reviewed this encounter including the documentation in this note and/or discussed this patient with the provider, Lauren Kennedy, AGPCNP-BC. I am certifying that I agree with the content of this note as supervising physician.  Devera Englander, DO [facility] 04/08/2017, 6:03 PM 

## 2017-04-08 NOTE — Assessment & Plan Note (Addendum)
Symptoms improving. Pt only taking PPI intermittently.  Plan: 1. Discontinue pantoprazole.  2. Start ranitidine 150 mg twice daily as needed for heartburn.   3. Recommend continued focus on diet for weight loss to improve symptoms.  Avoid triggers. 4. Follow up as needed.

## 2017-05-02 ENCOUNTER — Other Ambulatory Visit: Payer: Self-pay | Admitting: Nurse Practitioner

## 2017-05-02 DIAGNOSIS — K219 Gastro-esophageal reflux disease without esophagitis: Secondary | ICD-10-CM

## 2017-05-11 ENCOUNTER — Other Ambulatory Visit: Payer: Self-pay

## 2017-05-11 DIAGNOSIS — K219 Gastro-esophageal reflux disease without esophagitis: Secondary | ICD-10-CM

## 2017-05-11 MED ORDER — OMEPRAZOLE 40 MG PO CPDR: 40.0000 mg | DELAYED_RELEASE_CAPSULE | Freq: Every day | ORAL | 6 refills | Status: DC

## 2017-06-14 ENCOUNTER — Encounter: Payer: BLUE CROSS/BLUE SHIELD | Admitting: Nurse Practitioner

## 2017-06-14 ENCOUNTER — Other Ambulatory Visit: Payer: BLUE CROSS/BLUE SHIELD

## 2017-12-27 ENCOUNTER — Encounter: Payer: Self-pay | Admitting: Nurse Practitioner

## 2017-12-27 ENCOUNTER — Other Ambulatory Visit: Payer: Self-pay

## 2017-12-27 ENCOUNTER — Ambulatory Visit (INDEPENDENT_AMBULATORY_CARE_PROVIDER_SITE_OTHER): Payer: BLUE CROSS/BLUE SHIELD | Admitting: Nurse Practitioner

## 2017-12-27 VITALS — BP 127/80 | HR 74 | Ht 67.0 in | Wt 216.0 lb

## 2017-12-27 DIAGNOSIS — K219 Gastro-esophageal reflux disease without esophagitis: Secondary | ICD-10-CM

## 2017-12-27 DIAGNOSIS — F419 Anxiety disorder, unspecified: Secondary | ICD-10-CM

## 2017-12-27 DIAGNOSIS — G4762 Sleep related leg cramps: Secondary | ICD-10-CM | POA: Diagnosis not present

## 2017-12-27 DIAGNOSIS — R635 Abnormal weight gain: Secondary | ICD-10-CM

## 2017-12-27 MED ORDER — OMEPRAZOLE 40 MG PO CPDR
40.0000 mg | DELAYED_RELEASE_CAPSULE | Freq: Every day | ORAL | 3 refills | Status: DC
Start: 2017-12-27 — End: 2019-03-17

## 2017-12-27 MED ORDER — ESCITALOPRAM OXALATE 10 MG PO TABS: 10.0000 mg | ORAL_TABLET | Freq: Every day | ORAL | 2 refills | Status: DC

## 2017-12-27 NOTE — Patient Instructions (Addendum)
Angela Quinn,   Thank you for coming in to clinic today.  1. Take at least 5 minutes for yourself every day to do something fun.  2. You can keep a food log.  You can use a notebook or MyFitnessPal (app or website). - Work on mindful eating  3. Mindfulness for stress management may be helpful.  4. START escitalopram 10 mg tablet.  Take 1/2 tablet for 7 days.  Then, take full tablet daily and continue.   Please schedule a follow-up appointment with Angela Quinn, AGNP. Return in about 6 weeks (around 02/07/2018) for anxiety.  If you have any other questions or concerns, please feel free to call the clinic or send a message through [place]. You may also schedule an earlier appointment if necessary.  You will receive a survey after today's visit either digitally by e-mail or paper by [employer]. Your experiences and feedback matter to [place].  Please respond so we know how we are doing as we provide care for you.   Angela Smiles, DNP, AGNP-BC Adult Gerontology Nurse Practitioner [facility], [facility]    Stress and Stress Management Stress is a normal reaction to life events. It is what you feel when life demands more than you are used to or more than you can handle. Some stress can be useful. For example, the stress reaction can help you catch the last bus of the day, study for a test, or meet a deadline at work. But stress that occurs too often or for too long can cause problems. It can affect your emotional health and interfere with relationships and normal daily activities. Too much stress can weaken your immune system and increase your risk for physical illness. If you already have a medical problem, stress can make it worse. What are the causes? All sorts of life events may cause stress. An event that causes stress for one person may not be stressful for another person. Major life events commonly cause stress. These may be positive or negative. Examples include losing your  job, moving into a new home, getting married, having a baby, or losing a loved one. Less obvious life events may also cause stress, especially if they occur day after day or in combination. Examples include working long hours, driving in traffic, caring for children, being in debt, or being in a difficult relationship. What are the signs or symptoms? Stress may cause emotional symptoms including, the following:  Anxiety. This is feeling worried, afraid, on edge, overwhelmed, or out of control.  Anger. This is feeling irritated or impatient.  Depression. This is feeling sad, down, helpless, or guilty.  Difficulty focusing, remembering, or making decisions.  Stress may cause physical symptoms, including the following:  Aches and pains. These may affect your head, neck, back, stomach, or other areas of your body.  Tight muscles or clenched jaw.  Low energy or trouble sleeping.  Stress may cause unhealthy behaviors, including the following:  Eating to feel better (overeating) or skipping meals.  Sleeping too little, too much, or both.  Working too much or putting off tasks (procrastination).  Smoking, drinking alcohol, or using drugs to feel better.  How is this diagnosed? Stress is diagnosed through an assessment by your health care provider. Your health care provider will ask questions about your symptoms and any stressful life events.Your health care provider will also ask about your medical history and may order blood tests or other tests. Certain medical conditions and medicine can cause physical  symptoms similar to stress. Mental illness can cause emotional symptoms and unhealthy behaviors similar to stress. Your health care provider may refer you to a mental health professional for further evaluation. How is this treated? Stress management is the recommended treatment for stress.The goals of stress management are reducing stressful life events and coping with stress in healthy  ways. Techniques for reducing stressful life events include the following:  Stress identification. Self-monitor for stress and identify what causes stress for you. These skills may help you to avoid some stressful events.  Time management. Set your priorities, keep a calendar of events, and learn to say "no." These tools can help you avoid making too many commitments.  Techniques for coping with stress include the following:  Rethinking the problem. Try to think realistically about stressful events rather than ignoring them or overreacting. Try to find the positives in a stressful situation rather than focusing on the negatives.  Exercise. Physical exercise can release both physical and emotional tension. The key is to find a form of exercise you enjoy and do it regularly.  Relaxation techniques. These relax the body and mind. Examples include yoga, meditation, tai chi, biofeedback, deep breathing, progressive muscle relaxation, listening to music, being out in nature, journaling, and other hobbies. Again, the key is to find one or more that you enjoy and can do regularly.  Healthy lifestyle. Eat a balanced diet, get plenty of sleep, and do not smoke. Avoid using alcohol or drugs to relax.  Strong support network. Spend time with family, friends, or other people you enjoy being around.Express your feelings and talk things over with someone you trust.  Counseling or talktherapy with a mental health professional may be helpful if you are having difficulty managing stress on your own. Medicine is typically not recommended for the treatment of stress.Talk to your health care provider if you think you need medicine for symptoms of stress. Follow these instructions at home:  Keep all follow-up visits as directed by your health care provider.  Take all medicines as directed by your health care provider. Contact a health care provider if:  Your symptoms get worse or you start having new  symptoms.  You feel overwhelmed by your problems and can no longer manage them on your own. Get help right away if:  You feel like hurting yourself or someone else. This information is not intended to replace advice given to you by your health care provider. Make sure you discuss any questions you have with your health care provider. Document Released: 01/27/2001 Document Revised: 01/09/2016 Document Reviewed: 03/28/2013 Elsevier Interactive Patient Education  2017 [employer].

## 2017-12-27 NOTE — Progress Notes (Signed)
Subjective:    Patient ID: Angela Quinn, female    DOB: 1961/05/14, 57 y.o.   MRN: [id number]  Angela Quinn is a 57 y.o. 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  She has started noticing that everything frustrates her.  Finds herself snapping frequently.  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  Is believing it could be postmenopausal symptoms.  Other times feels like "everything has to be perfect."  These are things she previously "could care less about." - Is also busy with lots of things that she cannot get done.   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 - No significant life changes except has had recent death of son-in-law in truck accident.    -Increased stress has also caused pt to start having fever blisters, which she has had in remote past  Weight Gain Pt notes she has had weight gain over last several years of 50 lbs and is associated with stopping her periods.   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  She does take some meals with her in her refrigerator and cooks some in a crock pot in her truck, but she has had to share the refrigerator recently as she splits driving time in the same truck with another driver.    Leg pain Pt also notes bilateral leg pain and cramping: occurs only at night and feels toes curling under.  This awakens pt from sleep at least 2-3 nights per week over last 2 weeks.  Depression screen [facility] 2/9 12/27/2017 02/07/2016  Decreased Interest 2 0  Down, Depressed, Hopeless 2 0  PHQ - 2 Score 4 0  Altered sleeping 3 -  Tired, decreased energy 2 -  Change in appetite 3 -    Feeling bad or failure about yourself  0 -  Trouble concentrating 0 -  Moving slowly or fidgety/restless 0 -  Suicidal thoughts 0 -  PHQ-9 Score 12 -  Difficult doing work/chores Somewhat difficult -    GAD 7 : Generalized Anxiety Score 12/27/2017  Nervous, Anxious, on Edge 1  Control/stop worrying 3  Worry too much - different things 3  Trouble relaxing 2  Restless 3  Easily annoyed or irritable 3  Afraid - awful might happen 0  Total GAD 7 Score 15  Anxiety Difficulty Somewhat difficult    Social History   Tobacco Use  . Smoking status: Former Smoker    Packs/day: 1.00    Years: 20.00    Pack years: 20.00    Types: Cigarettes  . Smokeless tobacco: Never Used  Substance Use Topics  . Alcohol use: No    Alcohol/week: 0.0 oz  . Drug use: No    Review of Systems Per HPI unless specifically indicated above     Objective:    BP 127/80 (BP Location: Right Arm, Patient Position: Sitting, Cuff Size: Normal)   Pulse 74   Ht 5\' 7"  (1.702 m)   Wt 216 lb (98 kg)   BMI 33.83 kg/m   Wt Readings from Last 3 Encounters:  12/27/17 216 lb (98 kg)  04/06/17 210 lb 9.6  oz (95.5 kg)  11/27/16 220 lb (99.8 kg)    Physical Exam  Constitutional: She is oriented to person, place, and time. She appears well-developed. No distress.  Obese with central adiposity  Neck: Normal range of motion. Neck supple. Carotid bruit is not present.  Cardiovascular: Normal rate, regular rhythm, S1 normal, S2 normal, normal heart sounds and intact distal pulses.  Pulmonary/Chest: Effort normal and breath sounds normal. No respiratory distress.  Musculoskeletal: She exhibits no edema (pedal).  Neurological: She is alert and oriented to person, place, and time.  Skin: Skin is warm and dry.  Psychiatric: She has a normal mood and affect. Her behavior is normal.  Vitals reviewed.   Results for orders placed or performed in visit on 12/27/17  Hemoglobin A1c  Result Value Ref Range   Hgb A1c MFr  Bld 6.3 (H) <5.7 % of total Hgb   Mean Plasma Glucose 134 (calc)   eAG (mmol/L) 7.4 (calc)  TSH  Result Value Ref Range   TSH 2.43 0.40 - 4.50 mIU/L  COMPLETE METABOLIC PANEL WITH GFR  Result Value Ref Range   Glucose, Bld 90 65 - 99 mg/dL   BUN 18 7 - 25 mg/dL   Creat 1.61 0.96 - 0.45 mg/dL   GFR, Est Non African American 88 > OR = 60 mL/min/1.83m2   GFR, Est African American 103 > OR = 60 mL/min/1.24m2   BUN/Creatinine Ratio NOT APPLICABLE 6 - 22 (calc)   Sodium 138 135 - 146 mmol/L   Potassium 4.4 3.5 - 5.3 mmol/L   Chloride 107 98 - 110 mmol/L   CO2 25 20 - 32 mmol/L   Calcium 9.0 8.6 - 10.4 mg/dL   Total Protein 6.5 6.1 - 8.1 g/dL   Albumin 4.0 3.6 - 5.1 g/dL   Globulin 2.5 1.9 - 3.7 g/dL (calc)   AG Ratio 1.6 1.0 - 2.5 (calc)   Total Bilirubin 0.2 0.2 - 1.2 mg/dL   Alkaline phosphatase (APISO) 81 33 - 130 U/L   AST 17 10 - 35 U/L   ALT 13 6 - 29 U/L  Magnesium  Result Value Ref Range   Magnesium 2.0 1.5 - 2.5 mg/dL      Assessment & Plan:   Problem List Items Addressed This Visit      Digestive   GERD (gastroesophageal reflux disease) Stable GERD with use of omeprazole 40 mg once daily.  No current side effects and rare breakthrough heartburn.    Plan: 1. Continue omeprazole 40 mg once daily. Side effects discussed. Pt wants to continue med. 2. Avoid diet triggers. Reviewed need to seek care if globus sensation, difficulty swallowing, s/sx of GI bleed. 3. Follow up as needed and in 1 year.    Relevant Medications   omeprazole (PRILOSEC) 40 MG capsule    Other Visit Diagnoses    Weight gain    -  Primary Pt reported 50 lb weight gain.  Likely contributed to caloric overconsumption and frequent fast food meals.  Some concern for comorbid condition of diabetes with family history.  Plan: 1. TSH for identifying possible medical cause - normal thyroid function 2. A1c: normal at this time.  3. Encouraged healthy eating and reduction of fast food meals.  Consider  making mid-week stop at a grocery store to restock fridge when on road. 4. Followup 6 weeks.   Relevant Orders   Hemoglobin A1c (Completed)   TSH (Completed)   Leg cramps, sleep related     Acute leg  cramps only associated with night-time hours.  Possibly electrolyte abnormality, restless leg syndrome, vs increased stress.  Plan: 1. Labs for electrolyte and magnesium evaluation. 2. Encourage good stress management. 3. Consider adding medication for restless legs if no resolution over next several days to 6 weeks.  Pt to notify [facility] if unresolved or worsening.   Relevant Orders   COMPLETE METABOLIC PANEL WITH GFR (Completed)   Magnesium (Completed)   Anxiety     Worsening with increased irritability.  Stress is increased overall and pt is now unable to fully cope with stress as she has been able to do in past.    Plan: 1. Start escitalopram 10 mg once daily.  Reviewed common side effects.  May need to start with 1/2 tablet for 4-7 days then increase to full tablet and continue. 2. Reviewed stress management strategies. 3. Encouraged healthy lifestyle with healthy diet and increasing exercise 4. Followup 6 weeks.   Relevant Medications   escitalopram (LEXAPRO) 10 MG tablet      Meds ordered this encounter  Medications  . escitalopram (LEXAPRO) 10 MG tablet    Sig: Take 1 tablet (10 mg total) by mouth daily.    Dispense:  30 tablet    Refill:  2    Order Specific Question:   Supervising Provider    Answer:   Smitty Cords [[id number]]  . omeprazole (PRILOSEC) 40 MG capsule    Sig: Take 1 capsule (40 mg total) by mouth daily.    Dispense:  90 capsule    Refill:  3    Order Specific Question:   Supervising Provider    Answer:   Smitty Cords [[id number]]    Follow up plan: Return in about 6 weeks (around 02/07/2018) for anxiety.  Wilhelmina Mcardle, DNP, AGPCNP-BC Adult Gerontology Primary Care Nurse Practitioner [facility] 01/02/2018, 8:50 PM

## 2017-12-28 LAB — COMPLETE METABOLIC PANEL WITH GFR
AG Ratio: 1.6 (calc) (ref 1.0–2.5)
ALT: 13 U/L (ref 6–29)
AST: 17 U/L (ref 10–35)
Albumin: 4 g/dL (ref 3.6–5.1)
Alkaline phosphatase (APISO): 81 U/L (ref 33–130)
BUN: 18 mg/dL (ref 7–25)
CO2: 25 mmol/L (ref 20–32)
Calcium: 9 mg/dL (ref 8.6–10.4)
Chloride: 107 mmol/L (ref 98–110)
Creat: 0.75 mg/dL (ref 0.50–1.05)
GFR, Est African American: 103 mL/min/{1.73_m2} (ref >=60)
GFR, Est Non African American: 88 mL/min/{1.73_m2} (ref >=60)
Globulin: 2.5 g/dL (calc) (ref 1.9–3.7)
Glucose, Bld: 90 mg/dL (ref 65–99)
Potassium: 4.4 mmol/L (ref 3.5–5.3)
Sodium: 138 mmol/L (ref 135–146)
Total Bilirubin: 0.2 mg/dL (ref 0.2–1.2)
Total Protein: 6.5 g/dL (ref 6.1–8.1)

## 2017-12-28 LAB — HEMOGLOBIN A1C
Hgb A1c MFr Bld: 6.3 % of total Hgb — ABNORMAL HIGH (ref <5.7)
Mean Plasma Glucose: 134 (calc)
eAG (mmol/L): 7.4 (calc)

## 2017-12-28 LAB — MAGNESIUM: Magnesium: 2 mg/dL (ref 1.5–2.5)

## 2017-12-28 LAB — TSH: TSH: 2.43 mIU/L (ref 0.40–4.50)

## 2018-01-02 ENCOUNTER — Encounter: Payer: Self-pay | Admitting: Nurse Practitioner

## 2018-01-03 ENCOUNTER — Telehealth: Payer: Self-pay | Admitting: Nurse Practitioner

## 2018-01-03 DIAGNOSIS — F419 Anxiety disorder, unspecified: Secondary | ICD-10-CM

## 2018-01-03 DIAGNOSIS — R7303 Prediabetes: Secondary | ICD-10-CM | POA: Insufficient documentation

## 2018-01-03 MED ORDER — PAROXETINE HCL 10 MG PO TABS: ORAL_TABLET | ORAL | 0 refills | Status: DC

## 2018-01-03 MED ORDER — PAROXETINE HCL 20 MG PO TABS: 20.0000 mg | ORAL_TABLET | Freq: Every day | ORAL | 2 refills | Status: DC

## 2018-01-03 MED ORDER — METFORMIN HCL 500 MG PO TABS
500.0000 mg | ORAL_TABLET | Freq: Every day | ORAL | 1 refills | Status: DC
Start: 2018-01-03 — End: 2018-05-09

## 2018-01-03 NOTE — Telephone Encounter (Signed)
Pt.called  wanting to know if you could call in something milder for stomach (escitalopram, she having diarrhea) pt.also wanted to know if her metformin was called in . [facility]  [place].

## 2018-01-03 NOTE — Telephone Encounter (Signed)
Escitalopram: Took 1/2 pill x 2 days and has had nausea/vomiting.  Diarrhea/stomach pains as well.  Pt reports resolved symptoms after stopping medication for last 3 days.  Pt desires to try alternative medication. START paroxetine 5 mg x 7 days, 10 mg x 7 days, then 20 mg and continue to next appointment.  START metformin as previously directed, but instructed pt to start this after starting paroxetine because both medications have potential for GI upset.  Reinforced need to work on dietary sources of sugar/carbohydrates as previously described to patient.  Pt verbalizes understanding.

## 2018-01-25 ENCOUNTER — Other Ambulatory Visit: Payer: Self-pay | Admitting: Nurse Practitioner

## 2018-01-25 DIAGNOSIS — F419 Anxiety disorder, unspecified: Secondary | ICD-10-CM

## 2018-02-07 ENCOUNTER — Ambulatory Visit (INDEPENDENT_AMBULATORY_CARE_PROVIDER_SITE_OTHER): Payer: BLUE CROSS/BLUE SHIELD | Admitting: Nurse Practitioner

## 2018-02-07 ENCOUNTER — Other Ambulatory Visit: Payer: Self-pay

## 2018-02-07 ENCOUNTER — Encounter: Payer: Self-pay | Admitting: Nurse Practitioner

## 2018-02-07 DIAGNOSIS — G8929 Other chronic pain: Secondary | ICD-10-CM | POA: Diagnosis not present

## 2018-02-07 DIAGNOSIS — M25562 Pain in left knee: Secondary | ICD-10-CM | POA: Diagnosis not present

## 2018-02-07 DIAGNOSIS — F419 Anxiety disorder, unspecified: Secondary | ICD-10-CM | POA: Diagnosis not present

## 2018-02-07 DIAGNOSIS — M25561 Pain in right knee: Secondary | ICD-10-CM | POA: Diagnosis not present

## 2018-02-07 MED ORDER — DICLOFENAC SODIUM 75 MG PO TBEC: DELAYED_RELEASE_TABLET | ORAL | 0 refills | Status: DC

## 2018-02-07 NOTE — Patient Instructions (Addendum)
Angela Quinn,   Thank you for coming in to clinic today.  1. Continue metformin once daily  2. Continue paroxetine 20 mg once daily.  3. START diclofenac 75 mg tablet.  Take 1 tablet twice daily for 14 days.  Then, take twice daily as needed for moderate knee pain.  Please schedule a follow-up appointment with Wilhelmina Mcardle, AGNP. Return in about 3 months (around 05/10/2018) for anxiety, pre-diabetes.  If you have any other questions or concerns, please feel free to call the clinic or send a message through MyChart. You may also schedule an earlier appointment if necessary.  You will receive a survey after today's visit either digitally by e-mail or paper by [employer]. Your experiences and feedback matter to [place].  Please respond so we know how we are doing as we provide care for you.   Wilhelmina Mcardle, DNP, AGNP-BC Adult Gerontology Nurse Practitioner [facility], [facility]   Knee Exercises Ask your health care provider which exercises are safe for you. Do exercises exactly as told by your health care provider and adjust them as directed. It is normal to feel mild stretching, pulling, tightness, or discomfort as you do these exercises, but you should stop right away if you feel sudden pain or your pain gets worse.Do not begin these exercises until told by your health care provider. STRETCHING AND RANGE OF MOTION EXERCISES These exercises warm up your muscles and joints and improve the movement and flexibility of your knee. These exercises also help to relieve pain, numbness, and tingling. Exercise A: Knee Extension, Prone 1. Lie on your abdomen on a bed. 2. Place your left / right knee just beyond the edge of the surface so your knee is not on the bed. You can put a towel under your left / right thigh just above your knee for comfort. 3. Relax your leg muscles and allow gravity to straighten your knee. You should feel a stretch behind your left / right knee. 4. Hold this  position for ___30-60_______ seconds. 5. Scoot up so your knee is supported between repetitions. Repeat _5____ times. Complete this stretch _____2____ times a day.  Exercise B: Knee Flexion, Active  1. Lie on your back with both knees straight. If this causes back discomfort, bend your left / right knee so your foot is flat on the floor. 2. Slowly slide your left / right heel back toward your buttocks until you feel a gentle stretch in the front of your knee or thigh. 3. Hold this position for __________ seconds. 4. Slowly slide your left / right heel back to the starting position. Repeat __________ times. Complete this exercise __________ times a day. Exercise C: Quadriceps, Prone  1. Lie on your abdomen on a firm surface, such as a bed or padded floor. 2. Bend your left / right knee and hold your ankle. If you cannot reach your ankle or pant leg, loop a belt around your foot and grab the belt instead. 3. Gently pull your heel toward your buttocks. Your knee should not slide out to the side. You should feel a stretch in the front of your thigh and knee. 4. Hold this position for __________ seconds. Repeat __________ times. Complete this stretch __________ times a day. Exercise D: Hamstring, Supine 1. Lie on your back. 2. Loop a belt or towel over the ball of your left / right foot. The ball of your foot is on the walking surface, right under your toes. 3. Straighten your  left / right knee and slowly pull on the belt to raise your leg until you feel a gentle stretch behind your knee. ? Do not let your left / right knee bend while you do this. ? Keep your other leg flat on the floor. 4. Hold this position for __________ seconds. Repeat __________ times. Complete this stretch __________ times a day. STRENGTHENING EXERCISES These exercises build strength and endurance in your knee. Endurance is the ability to use your muscles for a long time, even after they get tired. Exercise E:  Quadriceps, Isometric  1. Lie on your back with your left / right leg extended and your other knee bent. Put a rolled towel or small pillow under your knee if told by your health care provider. 2. Slowly tense the muscles in the front of your left / right thigh. You should see your kneecap slide up toward your hip or see increased dimpling just above the knee. This motion will push the back of the knee toward the floor. 3. For __________ seconds, keep the muscle as tight as you can without increasing your pain. 4. Relax the muscles slowly and completely. Repeat __________ times. Complete this exercise __________ times a day. Exercise F: Straight Leg Raises - Quadriceps 1. Lie on your back with your left / right leg extended and your other knee bent. 2. Tense the muscles in the front of your left / right thigh. You should see your kneecap slide up or see increased dimpling just above the knee. Your thigh may even shake a bit. 3. Keep these muscles tight as you raise your leg 4-6 inches (10-15 cm) off the floor. Do not let your knee bend. 4. Hold this position for __________ seconds. 5. Keep these muscles tense as you lower your leg. 6. Relax your muscles slowly and completely after each repetition. Repeat __________ times. Complete this exercise __________ times a day. Exercise G: Hamstring, Isometric 1. Lie on your back on a firm surface. 2. Bend your left / right knee approximately __________ degrees. 3. Dig your left / right heel into the surface as if you are trying to pull it toward your buttocks. Tighten the muscles in the back of your thighs to dig as hard as you can without increasing any pain. 4. Hold this position for __________ seconds. 5. Release the tension gradually and allow your muscles to relax completely for __________ seconds after each repetition. Repeat __________ times. Complete this exercise __________ times a day. Exercise H: Hamstring Curls  If told by your health care  provider, do this exercise while wearing ankle weights. Begin with __________ weights. Then increase the weight by 1 lb (0.5 kg) increments. Do not wear ankle weights that are more than __________. 1. Lie on your abdomen with your legs straight. 2. Bend your left / right knee as far as you can without feeling pain. Keep your hips flat against the floor. 3. Hold this position for __________ seconds. 4. Slowly lower your leg to the starting position.  Repeat __________ times. Complete this exercise __________ times a day. Exercise I: Squats (Quadriceps) 1. Stand in front of a table, with your feet and knees pointing straight ahead. You may rest your hands on the table for balance but not for support. 2. Slowly bend your knees and lower your hips like you are going to sit in a chair. ? Keep your weight over your heels, not over your toes. ? Keep your lower legs upright so they are parallel  with the table legs. ? Do not let your hips go lower than your knees. ? Do not bend lower than told by your health care provider. ? If your knee pain increases, do not bend as low. 3. Hold the squat position for __________ seconds. 4. Slowly push with your legs to return to standing. Do not use your hands to pull yourself to standing. Repeat __________ times. Complete this exercise __________ times a day. Exercise J: Wall Slides (Quadriceps)  1. Lean your back against a smooth wall or door while you walk your feet out 18-24 inches (46-61 cm) from it. 2. Place your feet hip-width apart. 3. Slowly slide down the wall or door until your knees bend __________ degrees. Keep your knees over your heels, not over your toes. Keep your knees in line with your hips. 4. Hold for __________ seconds. Repeat __________ times. Complete this exercise __________ times a day. Exercise K: Straight Leg Raises - Hip Abductors 1. Lie on your side with your left / right leg in the top position. Lie so your head, shoulder, knee, and  hip line up. You may bend your bottom knee to help you keep your balance. 2. Roll your hips slightly forward so your hips are stacked directly over each other and your left / right knee is facing forward. 3. Leading with your heel, lift your top leg 4-6 inches (10-15 cm). You should feel the muscles in your outer hip lifting. ? Do not let your foot drift forward. ? Do not let your knee roll toward the ceiling. 4. Hold this position for __________ seconds. 5. Slowly return your leg to the starting position. 6. Let your muscles relax completely after each repetition. Repeat __________ times. Complete this exercise __________ times a day. Exercise L: Straight Leg Raises - Hip Extensors 1. Lie on your abdomen on a firm surface. You can put a pillow under your hips if that is more comfortable. 2. Tense the muscles in your buttocks and lift your left / right leg about 4-6 inches (10-15 cm). Keep your knee straight as you lift your leg. 3. Hold this position for __________ seconds. 4. Slowly lower your leg to the starting position. 5. Let your leg relax completely after each repetition. Repeat __________ times. Complete this exercise __________ times a day. This information is not intended to replace advice given to you by your health care provider. Make sure you discuss any questions you have with your health care provider. Document Released: 06/17/2005 Document Revised: 04/27/2016 Document Reviewed: 06/09/2015 Elsevier Interactive Patient Education  2018 [employer].

## 2018-02-07 NOTE — Progress Notes (Signed)
Subjective:    Patient ID: Angela Quinn, female    DOB: 1961-06-14, 57 y.o.   MRN: [id number]  Ravinder Hofland is a 57 y.o. female presenting on 02/07/2018 for Anxiety and Redness face (intermittent redness in the face & chest when pt wakes up)   HPI  Anxiety - Patient notes significant improvement in anxiety on paroxetine. She is very happy with current regimen.  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  GAD 7 : Generalized Anxiety Score 02/07/2018 12/27/2017  Nervous, Anxious, on Edge 0 1  Control/stop worrying 1 3  Worry too much - different things 1 3  Trouble relaxing 0 2  Restless 0 3  Easily annoyed or irritable 1 3  Afraid - awful might happen 0 0  Total GAD 7 Score 3 15  Anxiety Difficulty Somewhat difficult Somewhat difficult     Arthritis bilateral knees - Crepitus noted with catching of knees.  No always the same knee.  Left knee with pain for last 2 weeks.  R knee intermittent today.   - Patient has not had relief with OTC pain meds.  Especially difficult for patient when stepping into and out of her truck.  Face redness, chest skin - occasionally occurs and was happening before new paroxetine.  Face and chest are red.  Lasts several hours and not always repeated the next day.  Occurs about 1 time per month.   Social History   Tobacco Use  . Smoking status: Former Smoker    Packs/day: 1.00    Years: 20.00    Pack years: 20.00    Types: Cigarettes  . Smokeless tobacco: Never Used  Substance Use Topics  . Alcohol use: No    Alcohol/week: 0.0 standard drinks  . Drug use: No    Review of Systems Per HPI unless specifically indicated above     Objective:    There were no vitals taken for this visit.  Wt Readings from Last 3 Encounters:  05/09/18 215 lb 12.8 oz (97.9 kg)  12/27/17 216 lb (98 kg)  04/06/17 210 lb 9.6 oz (95.5 kg)    Physical Exam  Constitutional: She is oriented to person, place, and time. She appears  well-developed and well-nourished. No distress.  HENT:  Head: Normocephalic and atraumatic.  Eyes: Pupils are equal, round, and reactive to light. EOM are normal.  Neck: Normal range of motion. Neck supple.  Cardiovascular: Normal rate, regular rhythm, S1 normal, S2 normal, normal heart sounds and intact distal pulses.  Pulmonary/Chest: Effort normal and breath sounds normal. No respiratory distress.  Musculoskeletal:  Bilateral Knees Inspection: Normal appearance and symmetrical. No ecchymosis or effusion. Palpation: Non-tender. Mild crepitus with flex/ext bilaterally. ROM: Full active ROM bilaterally Special Testing: Lachman / Valgus/Varus tests negative with intact ligaments (ACL, MCL, LCL). McMurray negative without meniscus symptoms. Strength: 5/5 intact knee flex/ext, ankle dorsi/plantarflex Neurovascular: distally intact sensation light touch and pulses   Neurological: She is alert and oriented to person, place, and time.  Skin: Skin is warm and dry. Capillary refill takes less than 2 seconds.  Psychiatric: She has a normal mood and affect. Her behavior is normal. Judgment and thought content normal.  Vitals reviewed.    Results for orders placed or performed in visit on 12/27/17  Hemoglobin A1c  Result Value Ref Range   Hgb A1c MFr Bld 6.3 (H) <5.7 % of total Hgb   Mean Plasma Glucose 134 (calc)   eAG (mmol/L) 7.4 (calc)  TSH  Result Value Ref Range   TSH 2.43 0.40 - 4.50 mIU/L  COMPLETE METABOLIC PANEL WITH GFR  Result Value Ref Range   Glucose, Bld 90 65 - 99 mg/dL   BUN 18 7 - 25 mg/dL   Creat 1.61 0.96 - 0.45 mg/dL   GFR, Est Non African American 88 > OR = 60 mL/min/1.71m2   GFR, Est African American 103 > OR = 60 mL/min/1.71m2   BUN/Creatinine Ratio NOT APPLICABLE 6 - 22 (calc)   Sodium 138 135 - 146 mmol/L   Potassium 4.4 3.5 - 5.3 mmol/L   Chloride 107 98 - 110 mmol/L   CO2 25 20 - 32 mmol/L   Calcium 9.0 8.6 - 10.4 mg/dL   Total Protein 6.5 6.1 - 8.1 g/dL    Albumin 4.0 3.6 - 5.1 g/dL   Globulin 2.5 1.9 - 3.7 g/dL (calc)   AG Ratio 1.6 1.0 - 2.5 (calc)   Total Bilirubin 0.2 0.2 - 1.2 mg/dL   Alkaline phosphatase (APISO) 81 33 - 130 U/L   AST 17 10 - 35 U/L   ALT 13 6 - 29 U/L  Magnesium  Result Value Ref Range   Magnesium 2.0 1.5 - 2.5 mg/dL      Assessment & Plan:   Problem List Items Addressed This Visit      Other   Anxiety Significantly improved on paroxetine.  Patient with ability to continue daily activities.    Plan: 1. Continue paroxetine 20 mg once daily. 2. Continue work on [employer]. 3. Followup 3 months     Other Visit Diagnoses    Chronic pain of both knees    -  Primary Pain likely chronic and related to osteoarthritis with crepitus. No xrays to date.  Plan:  1. Treat with OTC pain meds (acetaminophen).  Discussed alternate dosing and max dosing with diclofenac. - START diclofenac 75 mg bid for 14 days then prn. 2. Apply heat and/or ice to affected area. 3. May also apply a muscle rub with lidocaine or lidocaine patch after heat or ice. 4. Consider Physical Therapy, ortho referral, xrays in future if needed. 5. Follow up 2-4 weeks prn.       Meds ordered this encounter  Medications  . diclofenac (VOLTAREN) 75 MG EC tablet    Sig: Take 1 tablet twice daily for 14 days.  Then take 1 tablet twice daily as needed for moderate knee pain.    Dispense:  45 tablet    Refill:  0    Order Specific Question:   Supervising Provider    Answer:   Smitty Cords [[id number]]    Follow up plan: Return in about 3 months (around 05/10/2018) for anxiety, pre-diabetes.  Wilhelmina Mcardle, DNP, AGPCNP-BC Adult Gerontology Primary Care Nurse Practitioner [facility] 05/09/2018, 2:46 PM

## 2018-03-07 ENCOUNTER — Other Ambulatory Visit: Payer: Self-pay | Admitting: Nurse Practitioner

## 2018-03-07 DIAGNOSIS — M25562 Pain in left knee: Principal | ICD-10-CM

## 2018-03-07 DIAGNOSIS — M25561 Pain in right knee: Principal | ICD-10-CM

## 2018-03-07 DIAGNOSIS — G8929 Other chronic pain: Secondary | ICD-10-CM

## 2018-05-09 ENCOUNTER — Encounter: Payer: Self-pay | Admitting: Nurse Practitioner

## 2018-05-09 ENCOUNTER — Other Ambulatory Visit: Payer: Self-pay

## 2018-05-09 ENCOUNTER — Ambulatory Visit (INDEPENDENT_AMBULATORY_CARE_PROVIDER_SITE_OTHER): Payer: BLUE CROSS/BLUE SHIELD | Admitting: Nurse Practitioner

## 2018-05-09 VITALS — BP 138/75 | HR 63 | Temp 97.6°F | Ht 67.0 in | Wt 215.8 lb

## 2018-05-09 DIAGNOSIS — K219 Gastro-esophageal reflux disease without esophagitis: Secondary | ICD-10-CM

## 2018-05-09 DIAGNOSIS — R7303 Prediabetes: Secondary | ICD-10-CM | POA: Diagnosis not present

## 2018-05-09 DIAGNOSIS — Z23 Encounter for immunization: Secondary | ICD-10-CM

## 2018-05-09 DIAGNOSIS — F419 Anxiety disorder, unspecified: Secondary | ICD-10-CM | POA: Diagnosis not present

## 2018-05-09 LAB — POCT GLYCOSYLATED HEMOGLOBIN (HGB A1C): Hemoglobin A1C: 6 % — AB (ref 4.0–5.6)

## 2018-05-09 MED ORDER — PAROXETINE HCL 20 MG PO TABS: 20.0000 mg | ORAL_TABLET | Freq: Every day | ORAL | 1 refills | Status: DC

## 2018-05-09 MED ORDER — BLOOD GLUCOSE METER KIT: PACK | 0 refills | Status: AC

## 2018-05-09 MED ORDER — METFORMIN HCL 500 MG PO TABS: 500.0000 mg | ORAL_TABLET | Freq: Every day | ORAL | 1 refills | Status: DC

## 2018-05-09 NOTE — Assessment & Plan Note (Signed)
Symptoms improving. Pt only taking PPI every other day.  Plan: 1. Continue omeprazole 20 mg once every other day. 2. Recommend continued focus on diet for weight loss to improve symptoms.  Avoid triggers. 3. Follow up as needed.

## 2018-05-09 NOTE — Progress Notes (Signed)
Subjective:    Patient ID: Angela Quinn, female    DOB: 02-18-1961, 57 y.o.   MRN: [id number]  Angela Quinn is a 58 y.o. female presenting on 05/09/2018 for Hyperglycemia and Anxiety   HPI Prediabetes Pt presents today for follow up of prediabetes. She is not checking CBG at home - would like a meter. - Current diabetic medications include: metformin - She is not currently symptomatic, but states she is occasionally shaky in early morning before eating.  - She denies polydipsia, polyphagia, polyuria, headaches, diaphoresis, chills, pain, numbness or tingling in extremities and changes in vision.   - Clinical course has been improving. - She  reports no regular exercise routine. - Her diet is moderate in salt, moderate in fat, and moderate in carbohydrates.  Is taking more food with her in her truck on trips, eating fewer meals at restaurants. - Weight trend: stable  Recent Labs    12/27/17 0859 05/09/18 0839  HGBA1C 6.3* 6.0*   GERD - She is not currently symptomatic.  - Symptoms started several years ago.  This has been associated with heartburn and symptoms primarily relate to meals, and lying down after meals.   - Symptoms appear to be worsened by fatty foods and large meals.   - She denies melena, hematochezia, hematemesis, and coffee ground emesis. They have tried proton pump inhibitor: omeprazole 20 mg every other day with relief. Risk factors present for GERD include obesity and NSAID use.  - No prior EGD.  Anxiety Patient notes she has had increasing anxiety since stopping her paroxetine. Has been off for about 1 month because of not being able to pick up refills.  Patient states she felt much better while taking this.  Has had increased fatigue off the medication as well.  Would like to continue drug.  GAD 7 : Generalized Anxiety Score 05/09/2018 02/07/2018 12/27/2017  Nervous, Anxious, on Edge 2 0 1  Control/stop worrying '2 1 3  '$ Worry too much - different things '2 1 3    '$ Trouble relaxing 2 0 2  Restless 1 0 3  Easily annoyed or irritable '3 1 3  '$ Afraid - awful might happen 0 0 0  Total GAD 7 Score '12 3 15  '$ Anxiety Difficulty Somewhat difficult Somewhat difficult Somewhat difficult     Depression screen [facility] 2/9 05/09/2018 02/07/2018 12/27/2017 02/07/2016  Decreased Interest 0 0 2 0  Down, Depressed, Hopeless 0 0 2 0  PHQ - 2 Score 0 0 4 0  Altered sleeping 1 0 3 -  Tired, decreased energy 2 0 2 -  Change in appetite 3 0 3 -  Feeling bad or failure about yourself  0 0 0 -  Trouble concentrating 0 1 0 -  Moving slowly or fidgety/restless 0 0 0 -  Suicidal thoughts 0 0 0 -  PHQ-9 Score '6 1 12 '$ -  Difficult doing work/chores Not difficult at all Not difficult at all Somewhat difficult -    Social History   Tobacco Use  . Smoking status: Former Smoker    Packs/day: 1.00    Years: 20.00    Pack years: 20.00    Types: Cigarettes  . Smokeless tobacco: Never Used  Substance Use Topics  . Alcohol use: No    Alcohol/week: 0.0 standard drinks  . Drug use: No    Review of Systems Per HPI unless specifically indicated above     Objective:    BP 138/75 (BP Location: Left Arm, Patient Position:  Sitting, Cuff Size: Large)   Pulse 63   Temp 97.6 F (36.4 C) (Oral)   Ht '5\' 7"'$  (1.702 m)   Wt 215 lb 12.8 oz (97.9 kg)   BMI 33.80 kg/m   Wt Readings from Last 3 Encounters:  05/09/18 215 lb 12.8 oz (97.9 kg)  12/27/17 216 lb (98 kg)  04/06/17 210 lb 9.6 oz (95.5 kg)    Physical Exam  Constitutional: She is oriented to person, place, and time. She appears well-developed and well-nourished. No distress.  HENT:  Head: Normocephalic and atraumatic.  Eyes: Pupils are equal, round, and reactive to light. Conjunctivae and EOM are normal.  Fundoscopic exam:      The right eye shows no AV nicking, no exudate, no hemorrhage and no papilledema. The right eye shows no red reflex.       The left eye shows no AV nicking, no exudate, no hemorrhage and no  papilledema. The left eye shows no red reflex.  Neck: Normal range of motion. Neck supple. Carotid bruit is not present.  Cardiovascular: Normal rate, regular rhythm, S1 normal, S2 normal, normal heart sounds and intact distal pulses.  Pulmonary/Chest: Effort normal and breath sounds normal. No respiratory distress.  Abdominal: Soft. Bowel sounds are normal. She exhibits no distension. There is no tenderness.  Musculoskeletal: She exhibits no edema (pedal).  Neurological: She is alert and oriented to person, place, and time.  Skin: Skin is warm and dry. Capillary refill takes less than 2 seconds.  Psychiatric: She has a normal mood and affect. Her behavior is normal. Judgment and thought content normal.  Vitals reviewed.    Results for orders placed or performed in visit on 05/09/18  POCT glycosylated hemoglobin (Hb A1C)  Result Value Ref Range   Hemoglobin A1C 6.0 (A) 4.0 - 5.6 %   HbA1c POC (<> result, manual entry)     HbA1c, POC (prediabetic range)     HbA1c, POC (controlled diabetic range)        Assessment & Plan:   Problem List Items Addressed This Visit      Digestive   GERD (gastroesophageal reflux disease)    Symptoms improving. Pt only taking PPI every other day.  Plan: 1. Continue omeprazole 20 mg once every other day. 2. Recommend continued focus on diet for weight loss to improve symptoms.  Avoid triggers. 3. Follow up as needed.        Other   Prediabetes - Primary    Patient with stable A1c, reduced to 6.0% from 6.3% on metformin 500 mg in am.  Patient with stable weight since last visit, continues to be less than 1.5 years ago when patient weighed nearly 220 lbs.  Plan: 1. Encouraged patient to continue work toward healthy eating. 2. Continue metformin 500 mg daily 3. Repeat A1c today, other labs at next appointment. 4. Start checking CBG in am and when symptomatic.  Reviewed normal CBG between 70-100 and permissively up to 120 for  prediabetes/diabetes. 5. Followup 6 months      Relevant Medications   blood glucose meter kit and supplies   metFORMIN (GLUCOPHAGE) 500 MG tablet   Other Relevant Orders   POCT glycosylated hemoglobin (Hb A1C) (Completed)   Anxiety    Worsening since last visit off medications for 30 days.  Patient was taking paroxetine 20 mg once daily with good results.  Increasing agitation and fatigue are present now that patient is of medication.  Plan: 1. Restart paroxetine 20 mg once daily  2. Continue good non-pharmacological stress management strategies, reframing, deep breathing, etc. 3. Followup 6 months and as needed.      Relevant Medications   PARoxetine (PAXIL) 20 MG tablet    Other Visit Diagnoses    Need for immunization against influenza       Relevant Orders   Flu Vaccine QUAD 6+ mos PF IM (Fluarix Quad PF)      Meds ordered this encounter  Medications  . blood glucose meter kit and supplies    Sig: Dispense based on patient and insurance preference. Use once daily as directed.    Dispense:  1 each    Refill:  0    Order Specific Question:   Supervising Provider    Answer:   Olin Hauser [[id number]]    Order Specific Question:   Number of strips    Answer:   100    Order Specific Question:   Number of lancets    Answer:   100  . metFORMIN (GLUCOPHAGE) 500 MG tablet    Sig: Take 1 tablet (500 mg total) by mouth daily with breakfast.    Dispense:  90 tablet    Refill:  1    Order Specific Question:   Supervising Provider    Answer:   Olin Hauser [[id number]]  . PARoxetine (PAXIL) 20 MG tablet    Sig: Take 1 tablet (20 mg total) by mouth daily.    Dispense:  90 tablet    Refill:  1    Order Specific Question:   Supervising Provider    Answer:   Olin Hauser [[id number]]    Follow up plan: Return in about 6 months (around 11/07/2018) for anxiety, prediabetes.  [name], DNP, AGPCNP-BC Adult Gerontology Primary Care Nurse  Practitioner [facility] 05/09/2018, 8:42 AM

## 2018-05-09 NOTE — Assessment & Plan Note (Signed)
Worsening since last visit off medications for 30 days.  Patient was taking paroxetine 20 mg once daily with good results.  Increasing agitation and fatigue are present now that patient is of medication.  Plan: 1. Restart paroxetine 20 mg once daily 2. Continue good non-pharmacological stress management strategies, reframing, deep breathing, etc. 3. Followup 6 months and as needed.

## 2018-05-09 NOTE — Patient Instructions (Addendum)
Magdalyn Ipina,   Thank you for coming in to clinic today.   1. Continue all medications without changes.  Please schedule a follow-up appointment with Wilhelmina Mcardle, AGNP. Return in about 6 months (around 11/07/2018) for anxiety, prediabetes.  If you have any other questions or concerns, please feel free to call the clinic or send a message through MyChart. You may also schedule an earlier appointment if necessary.  You will receive a survey after today's visit either digitally by e-mail or paper by [employer]. Your experiences and feedback matter to [place].  Please respond so we know how we are doing as we provide care for you.   Wilhelmina Mcardle, DNP, AGNP-BC Adult Gerontology Nurse Practitioner [facility], [facility]

## 2018-05-09 NOTE — Assessment & Plan Note (Signed)
Patient with stable A1c, reduced to 6.0% from 6.3% on metformin 500 mg in am.  Patient with stable weight since last visit, continues to be less than 1.5 years ago when patient weighed nearly 220 lbs.  Plan: 1. Encouraged patient to continue work toward healthy eating. 2. Continue metformin 500 mg daily 3. Repeat A1c today, other labs at next appointment. 4. Start checking CBG in am and when symptomatic.  Reviewed normal CBG between 70-100 and permissively up to 120 for prediabetes/diabetes. 5. Followup 6 months

## 2018-08-05 ENCOUNTER — Other Ambulatory Visit: Payer: Self-pay | Admitting: Nurse Practitioner

## 2018-11-07 ENCOUNTER — Ambulatory Visit (INDEPENDENT_AMBULATORY_CARE_PROVIDER_SITE_OTHER): Payer: BLUE CROSS/BLUE SHIELD | Admitting: Nurse Practitioner

## 2018-11-07 ENCOUNTER — Other Ambulatory Visit: Payer: Self-pay

## 2018-11-07 ENCOUNTER — Encounter: Payer: Self-pay | Admitting: Nurse Practitioner

## 2018-11-07 VITALS — BP 144/68 | HR 67 | Temp 98.0°F | Ht 67.0 in | Wt 219.2 lb

## 2018-11-07 DIAGNOSIS — R030 Elevated blood-pressure reading, without diagnosis of hypertension: Secondary | ICD-10-CM

## 2018-11-07 DIAGNOSIS — F5104 Psychophysiologic insomnia: Secondary | ICD-10-CM | POA: Diagnosis not present

## 2018-11-07 DIAGNOSIS — R7303 Prediabetes: Secondary | ICD-10-CM

## 2018-11-07 MED ORDER — TRAZODONE HCL 50 MG PO TABS: 25.0000 mg | ORAL_TABLET | Freq: Every evening | ORAL | 5 refills | Status: DC | PRN

## 2018-11-07 NOTE — Progress Notes (Signed)
Subjective:    Patient ID: Angela Quinn, female    DOB: [birth year], 58 y.o.   MRN: [id number]  Angela Quinn is a 58 y.o. female presenting on 11/07/2018 for Anxiety (the discontinued the Paxil x ago, because her symptoms didn't improve ) and Diabetes   HPI Anxiety Patient has stopped medications - didn't notice a difference when stopping.  Notes she still has mind wandering when lying down at night, but this is her only primary symptom of anxiety.  This does cause her to have some sleep disruption.      Prediabetes Patient is noticing she feels "a little weird" (shaky, jittery, weak) and goes away after eating a piece of fruit.  This is occurring a few times per week, but always resolves after eating. - Patient continues to take metformin 500 mg daily with breakfast and tolerates well without side effects.  Typical daily intake Breakfast: Coffee and fast food sandwich or bagel with slice tomato and cream cheese.  Fruit morning snack Lunch: more snacks - pickles and tomatoes, chips, nuts, fruit (3-4 pieces total daily), cheese occasionally Dinner: Largest meal: carries some food (2-3 meals per week), but has meals out (2-3 meals out with fast food).  - She is not currently symptomatic and denies polydipsia, polyphagia, polyuria, headaches, diaphoresis, chills, pain, numbness or tingling in extremities and changes in vision.    Recent Labs    12/27/17 0859 05/09/18 0839 11/07/18 0921  HGBA1C 6.3* 6.0* 6.2*    GAD 7 : Generalized Anxiety Score 11/07/2018 05/09/2018 02/07/2018 12/27/2017  Nervous, Anxious, on Edge 0 2 0 1  Control/stop worrying 1 2 1 3   Worry too much - different things 1 2 1 3   Trouble relaxing 1 2 0 2  Restless 1 1 0 3  Easily annoyed or irritable 2 3 1 3   Afraid - awful might happen 0 0 0 0  Total GAD 7 Score 6 12 3 15   Anxiety Difficulty Somewhat difficult Somewhat difficult Somewhat difficult Somewhat difficult     Depression screen [facility] 2/9 11/07/2018  05/09/2018 02/07/2018 12/27/2017 02/07/2016  Decreased Interest 0 0 0 2 0  Down, Depressed, Hopeless 0 0 0 2 0  PHQ - 2 Score 0 0 0 4 0  Altered sleeping 3 1 0 3 -  Tired, decreased energy 0 2 0 2 -  Change in appetite 0 3 0 3 -  Feeling bad or failure about yourself  0 0 0 0 -  Trouble concentrating 0 0 1 0 -  Moving slowly or fidgety/restless 0 0 0 0 -  Suicidal thoughts 0 0 0 0 -  PHQ-9 Score 3 6 1 12  -  Difficult doing work/chores Not difficult at all Not difficult at all Not difficult at all Somewhat difficult -     Social History   Tobacco Use  . Smoking status: Former Smoker    Packs/day: 1.00    Years: 20.00    Pack years: 20.00    Types: Cigarettes  . Smokeless tobacco: Never Used  Substance Use Topics  . Alcohol use: No    Alcohol/week: 0.0 standard drinks  . Drug use: No   Review of Systems Per HPI unless specifically indicated above     Objective:    BP (!) 144/68 (BP Location: Right Arm, Patient Position: Sitting, Cuff Size: Large)   Pulse 67   Temp 98 F (36.7 C) (Oral)   Ht 5\' 7"  (1.702 m)   Wt 219 lb 3.2  oz (99.4 kg)   BMI 34.33 kg/m   Wt Readings from Last 3 Encounters:  11/07/18 219 lb 3.2 oz (99.4 kg)  05/09/18 215 lb 12.8 oz (97.9 kg)  12/27/17 216 lb (98 kg)    Physical Exam Vitals signs reviewed.  Constitutional:      General: She is not in acute distress.    Appearance: She is well-developed.  HENT:     Head: Normocephalic and atraumatic.  Cardiovascular:     Rate and Rhythm: Normal rate and regular rhythm.     Pulses:          Radial pulses are 2+ on the right side and 2+ on the left side.       Posterior tibial pulses are 1+ on the right side and 1+ on the left side.     Heart sounds: Normal heart sounds, S1 normal and S2 normal.  Pulmonary:     Effort: Pulmonary effort is normal. No respiratory distress.     Breath sounds: Normal breath sounds and air entry.  Musculoskeletal:     Right lower leg: No edema.     Left lower leg: No  edema.  Skin:    General: Skin is warm and dry.     Capillary Refill: Capillary refill takes less than 2 seconds.  Neurological:     Mental Status: She is alert and oriented to person, place, and time.  Psychiatric:        Attention and Perception: Attention normal.        Mood and Affect: Mood and affect normal.        Behavior: Behavior normal. Behavior is cooperative.     Results for orders placed or performed in visit on 05/09/18  POCT glycosylated hemoglobin (Hb A1C)  Result Value Ref Range   Hemoglobin A1C 6.0 (A) 4.0 - 5.6 %   HbA1c POC (<> result, manual entry)     HbA1c, POC (prediabetic range)     HbA1c, POC (controlled diabetic range)        Assessment & Plan:   Problem List Items Addressed This Visit      Other   Prediabetes   Relevant Orders   Hemoglobin A1c (Completed)    Other Visit Diagnoses    Psychophysiologic insomnia    -  Primary   Relevant Medications   traZODone (DESYREL) 50 MG tablet      # Prediabetes Well-controlledDM with A1c remaining < 6.4%.  Stable from 3 months ago and goal A1c < 7.0%. - Possible hypoglycemia, but is always treated/resolved quickly.  No CBG records during events  Plan:  1. Continue current therapy: metformin 500 mg daily 2. Encourage improved lifestyle: - low carb/low glycemic diet reinforced prior education - Increase physical activity to 30 minutes most days of the week.  Explained that increased physical activity increases body's use of sugar for energy. 3. Check fasting am CBG and bring log to next visit for review.  Also check CBG when having signs and symptoms of low cbg 4. Labs today 5. Follow-up 3 months    #Anxiety, Insomnia Anxiety well managed with non-pharm coping skills except at bedtime.  Insomnia not currently well-controlled.  Plan: 1. START trazodone 50 mg tab.  Take 1/2-1 tab po daily at bedtime. 2. Encouraged good sleep hygiene - tip sheet provided. 3. Follow-up 3 months.  # Elevated BP  reading without hypertension  Patient with increased BP today.  May need future start of meds. Encouraged patient to check  BP at home 1-2 times per week.  Report readings to clinic if remain above 130/80 regularly.  Encouraged low sodium diet. - FOLLOW-UP 3 months.  Meds ordered this encounter  Medications  . traZODone (DESYREL) 50 MG tablet    Sig: Take 0.5-1 tablets (25-50 mg total) by mouth at bedtime as needed for sleep.    Dispense:  30 tablet    Refill:  5    Order Specific Question:   Supervising Provider    Answer:   Smitty Cords [[id number]]   Follow up plan: Return in about 3 months (around 02/07/2019) for hypertension, pre-diabetes, insomnia.  Wilhelmina Mcardle, DNP, AGPCNP-BC Adult Gerontology Primary Care Nurse Practitioner [facility] 11/07/2018, 8:22 AM

## 2018-11-07 NOTE — Patient Instructions (Addendum)
Angela Quinn,   Thank you for coming in to clinic today.  1. Prediabetes: continue healthy eating patterns.   - For weight loss: increase water intake (64 oz water or more) - With fruit and other snacks - balance carb with some small protein (nut, cheese, cottage cheese[high sodium CAUTION], greek yogurt, meat sticks)  2.  For sleep: - START TRAZODONE 50 mg tablet - Take 1/2 - 1 tablet at bedtime for sleep and anxiety.  Caution for daytime drowsiness at start or if dose too high.   - We do have another option of sleep.   3. For blood pressure: - Watch sodium intake - Try to keep around 2500 mg sodium per day.  Check your nutrition labels - watch out for cottage cheese, chips, and pickles.  Fast food meals - ask for fries unsalted and watch sauces (hot sauce, ketchup, vinaigrettes).  Please schedule a follow-up appointment with Wilhelmina Mcardle, AGNP. Return in about 3 months (around 02/07/2019) for hypertension, pre-diabetes, insomnia.  If you have any other questions or concerns, please feel free to call the clinic or send a message through MyChart. You may also schedule an earlier appointment if necessary.  You will receive a survey after today's visit either digitally by e-mail or paper by [employer]. Your experiences and feedback matter to [place].  Please respond so we know how we are doing as we provide care for you.   Wilhelmina Mcardle, DNP, AGNP-BC Adult Gerontology Nurse Practitioner [facility], [facility]

## 2018-11-08 LAB — HEMOGLOBIN A1C
Est. average glucose Bld gHb Est-mCnc: 131 mg/dL
Hgb A1c MFr Bld: 6.2 % — ABNORMAL HIGH (ref 4.8–5.6)

## 2018-11-10 ENCOUNTER — Encounter: Payer: Self-pay | Admitting: Nurse Practitioner

## 2018-11-10 DIAGNOSIS — R030 Elevated blood-pressure reading, without diagnosis of hypertension: Secondary | ICD-10-CM | POA: Insufficient documentation

## 2018-11-29 ENCOUNTER — Other Ambulatory Visit: Payer: Self-pay | Admitting: Nurse Practitioner

## 2018-11-29 DIAGNOSIS — F5104 Psychophysiologic insomnia: Secondary | ICD-10-CM

## 2018-12-19 DIAGNOSIS — H2512 Age-related nuclear cataract, left eye: Secondary | ICD-10-CM | POA: Diagnosis not present

## 2018-12-19 DIAGNOSIS — H2511 Age-related nuclear cataract, right eye: Secondary | ICD-10-CM | POA: Diagnosis not present

## 2018-12-19 DIAGNOSIS — E119 Type 2 diabetes mellitus without complications: Secondary | ICD-10-CM | POA: Diagnosis not present

## 2018-12-19 DIAGNOSIS — H019 Unspecified inflammation of eyelid: Secondary | ICD-10-CM | POA: Diagnosis not present

## 2019-01-31 DIAGNOSIS — Z01818 Encounter for other preprocedural examination: Secondary | ICD-10-CM | POA: Diagnosis not present

## 2019-01-31 DIAGNOSIS — Z1159 Encounter for screening for other viral diseases: Secondary | ICD-10-CM | POA: Diagnosis not present

## 2019-01-31 DIAGNOSIS — H2512 Age-related nuclear cataract, left eye: Secondary | ICD-10-CM | POA: Diagnosis not present

## 2019-02-02 DIAGNOSIS — H2511 Age-related nuclear cataract, right eye: Secondary | ICD-10-CM | POA: Diagnosis not present

## 2019-02-02 DIAGNOSIS — H25812 Combined forms of age-related cataract, left eye: Secondary | ICD-10-CM | POA: Diagnosis not present

## 2019-02-02 DIAGNOSIS — H2512 Age-related nuclear cataract, left eye: Secondary | ICD-10-CM | POA: Diagnosis not present

## 2019-02-03 DIAGNOSIS — H2512 Age-related nuclear cataract, left eye: Secondary | ICD-10-CM | POA: Diagnosis not present

## 2019-02-03 DIAGNOSIS — H2511 Age-related nuclear cataract, right eye: Secondary | ICD-10-CM | POA: Diagnosis not present

## 2019-02-07 ENCOUNTER — Ambulatory Visit: Payer: BLUE CROSS/BLUE SHIELD | Admitting: Nurse Practitioner

## 2019-02-20 DIAGNOSIS — H2512 Age-related nuclear cataract, left eye: Secondary | ICD-10-CM | POA: Diagnosis not present

## 2019-02-27 DIAGNOSIS — Z01812 Encounter for preprocedural laboratory examination: Secondary | ICD-10-CM | POA: Diagnosis not present

## 2019-02-27 DIAGNOSIS — H2511 Age-related nuclear cataract, right eye: Secondary | ICD-10-CM | POA: Diagnosis not present

## 2019-02-27 DIAGNOSIS — Z1159 Encounter for screening for other viral diseases: Secondary | ICD-10-CM | POA: Diagnosis not present

## 2019-03-14 DIAGNOSIS — H2511 Age-related nuclear cataract, right eye: Secondary | ICD-10-CM | POA: Diagnosis not present

## 2019-03-14 DIAGNOSIS — Z1159 Encounter for screening for other viral diseases: Secondary | ICD-10-CM | POA: Diagnosis not present

## 2019-03-16 ENCOUNTER — Other Ambulatory Visit: Payer: Self-pay | Admitting: Nurse Practitioner

## 2019-03-16 DIAGNOSIS — H25811 Combined forms of age-related cataract, right eye: Secondary | ICD-10-CM | POA: Diagnosis not present

## 2019-03-16 DIAGNOSIS — K219 Gastro-esophageal reflux disease without esophagitis: Secondary | ICD-10-CM

## 2019-03-16 DIAGNOSIS — H2511 Age-related nuclear cataract, right eye: Secondary | ICD-10-CM | POA: Diagnosis not present

## 2019-03-20 ENCOUNTER — Other Ambulatory Visit: Payer: Self-pay

## 2019-03-20 ENCOUNTER — Ambulatory Visit (INDEPENDENT_AMBULATORY_CARE_PROVIDER_SITE_OTHER): Payer: BC Managed Care – PPO | Admitting: Nurse Practitioner

## 2019-03-20 ENCOUNTER — Encounter: Payer: Self-pay | Admitting: Nurse Practitioner

## 2019-03-20 VITALS — BP 135/78 | HR 59 | Ht 67.0 in | Wt 221.8 lb

## 2019-03-20 DIAGNOSIS — M25552 Pain in left hip: Secondary | ICD-10-CM | POA: Diagnosis not present

## 2019-03-20 DIAGNOSIS — R7303 Prediabetes: Secondary | ICD-10-CM | POA: Diagnosis not present

## 2019-03-20 DIAGNOSIS — M7062 Trochanteric bursitis, left hip: Secondary | ICD-10-CM

## 2019-03-20 LAB — POCT GLYCOSYLATED HEMOGLOBIN (HGB A1C): Hemoglobin A1C: 6.2 % — AB (ref 4.0–5.6)

## 2019-03-20 MED ORDER — METFORMIN HCL 500 MG PO TABS: 500.0000 mg | ORAL_TABLET | Freq: Every day | ORAL | 1 refills | Status: DC

## 2019-03-20 MED ORDER — DICLOFENAC SODIUM 75 MG PO TBEC: DELAYED_RELEASE_TABLET | ORAL | 2 refills | Status: DC
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
## 2019-03-20 NOTE — Patient Instructions (Addendum)
Angela Quinn,   Thank you for coming in to clinic today.  1. Restart diclofenac 75 mg twice daily for 14 days.  Then take as needed for your hip pain.  If not getting better in about 2 weeks, you can schedule a hip injection with Dr. Parks Ranger   Please schedule a follow-up appointment with [name], AGNP. Return in about 6 months (around 09/20/2019) for pre-diabetes.  If you have any other questions or concerns, please feel free to call the clinic or send a message through [place]. You may also schedule an earlier appointment if necessary.  You will receive a survey after today's visit either digitally by e-mail or paper by [employer]. Your experiences and feedback matter to [place].  Please respond so we know how we are doing as we provide care for you.   [name], DNP, AGNP-BC Adult Gerontology Nurse Practitioner [facility], [facility]   Hip Bursitis  Hip bursitis is inflammation of a fluid-filled sac (bursa) in the hip joint. The bursa prevents the bones in the hip joint from rubbing against each other. Hip bursitis can cause mild to moderate pain, and symptoms often come and go over time. What are the causes? This condition may be caused by:  Injury to the hip.  Overuse of the muscles that surround the hip joint.  Previous injury or surgery of the hip.  Arthritis or gout.  Diabetes.  Thyroid disease.  Infection. In some cases, the cause may not be known. What are the signs or symptoms? Symptoms of this condition include:  Mild or moderate pain in the hip area. Pain may get worse with movement.  Tenderness and swelling of the hip, especially on the outer side of the hip.  In rare cases, the bursa may become infected. This may cause a fever, as well as warmth and redness in the area. Symptoms may come and go. How is this diagnosed? This condition may be diagnosed based on:  A physical exam.  Your medical history.  X-rays.  Removal of fluid  from your inflamed bursa for testing (biopsy). You may be sent to a health care provider who specializes in bone diseases (orthopedist) or a provider who specializes in joint inflammation (rheumatologist). How is this treated? This condition is treated by resting, icing, applying pressure (compression), and raising (elevating) the injured area. This is called RICE treatment. In some cases, this may be enough to make your symptoms go away. Treatment may also include:  Using crutches.  Draining fluid out of the bursa to help relieve swelling.  Injecting medicine that helps to reduce inflammation (cortisone).  Additional medicines if the bursa is infected. Follow these instructions at home: Managing pain, stiffness, and swelling   If directed, put ice on the painful area. ? Put ice in a plastic bag. ? Place a towel between your skin and the bag. ? Leave the ice on for 20 minutes, 2-3 times a day. ? Raise (elevate) your hip above the level of your heart as much as you can without pain. To do this, try putting a pillow under your hips while you lie down. Activity  Return to your normal activities as told by your health care provider. Ask your health care provider what activities are safe for you.  Rest and protect your hip as much as possible until your pain and swelling get better. General instructions  Take over-the-counter and prescription medicines only as told by your health care provider.  Wear compression wraps  only as told by your health care provider.  Do not use your hip to support your body weight until your health care provider says that you can. Use crutches as told by your health care provider.  Gently massage and stretch your injured area as often as is comfortable.  Keep all follow-up visits as told by your health care provider. This is important. How is this prevented?  Exercise regularly, as told by your health care provider.  Warm up and stretch before being  active.  Cool down and stretch after being active.  If an activity irritates your hip or causes pain, avoid the activity as much as possible.  Avoid sitting down for long periods at a time. Contact a health care provider if you:  Have a fever.  Develop new symptoms.  Have difficulty walking or doing everyday activities.  Have pain that gets worse or does not get better with medicine.  Develop red skin or a feeling of warmth in your hip area. Get help right away if you:  Cannot move your hip.  Have severe pain. Summary  Hip bursitis is inflammation of a fluid-filled sac (bursa) in the hip joint.  Hip bursitis can cause mild to moderate pain, and symptoms often come and go over time.  This condition is treated with rest, ice, compression, elevation, and medicines. This information is not intended to replace advice given to you by your health care provider. Make sure you discuss any questions you have with your health care provider. Document Released: 01/23/2002 Document Revised: 04/11/2018 Document Reviewed: 04/11/2018 Elsevier Patient Education  2020 [employer].

## 2019-04-03 DIAGNOSIS — H43391 Other vitreous opacities, right eye: Secondary | ICD-10-CM | POA: Diagnosis not present

## 2019-04-26 DIAGNOSIS — L0100 Impetigo, unspecified: Secondary | ICD-10-CM | POA: Diagnosis not present

## 2019-04-26 DIAGNOSIS — Z6837 Body mass index (BMI) 37.0-37.9, adult: Secondary | ICD-10-CM | POA: Diagnosis not present

## 2019-04-26 DIAGNOSIS — T7840XA Allergy, unspecified, initial encounter: Secondary | ICD-10-CM | POA: Diagnosis not present

## 2019-05-01 ENCOUNTER — Ambulatory Visit (INDEPENDENT_AMBULATORY_CARE_PROVIDER_SITE_OTHER): Payer: BC Managed Care – PPO | Admitting: Nurse Practitioner

## 2019-05-01 ENCOUNTER — Other Ambulatory Visit: Payer: Self-pay

## 2019-05-01 ENCOUNTER — Encounter: Payer: Self-pay | Admitting: Nurse Practitioner

## 2019-05-01 VITALS — BP 141/80 | HR 65 | Ht 67.0 in | Wt 222.4 lb

## 2019-05-01 DIAGNOSIS — L237 Allergic contact dermatitis due to plants, except food: Secondary | ICD-10-CM | POA: Diagnosis not present

## 2019-05-01 MED ORDER — PREDNISONE 20 MG PO TABS: ORAL_TABLET | ORAL | 0 refills | Status: DC

## 2019-05-01 MED ORDER — METHYLPREDNISOLONE ACETATE 80 MG/ML IJ SUSP
60.0000 mg | Freq: Once | INTRAMUSCULAR | Status: AC
  Administered 2019-05-01: 60 mg via INTRAMUSCULAR

## 2019-05-01 NOTE — Patient Instructions (Addendum)
Angela RilesDianna Quinn,   Thank you for coming in to clinic today.  1. DO not burn poison ivy in future.  2. Take prednisone taper 20 mg tablets.  Take when you wake up. Day 1-4 (Today): Take 3 pills at one time Day 5-6: Take 2 pills  Day 7-8: Take 1 pills Day 9-10: Take 1/2 pill then stop.  3. You got an injection of depomedrol 60 mg in your muscle today.   Please schedule a follow-up appointment with Angela McardleLauren Katrinna Quinn, AGNP. Return 1-2 weeks if symptoms worsen or fail to improve.  If you have any other questions or concerns, please feel free to call the clinic or send a message through MyChart. You may also schedule an earlier appointment if necessary.  You will receive a survey after today's visit either digitally by e-mail or paper by [employer] mail. Your experiences and feedback matter to us.  Please respond so we know how we are doing as we provide care for you.   Angela McardleLauren Khi Mcmillen, DNP, AGNP-BC Adult Gerontology Nurse Practitioner [facility], [facility]    Poison Ivy Dermatitis Poison ivy dermatitis is inflammation of the skin that is caused by chemicals in the leaves of the poison ivy plant. The skin reaction often involves redness, swelling, blisters, and extreme itching. What are the causes? This condition is caused by a chemical (urushiol) found in the sap of the poison ivy plant. This chemical is sticky and can be easily spread to people, animals, and objects. You can get poison ivy dermatitis by:  Having direct contact with a poison ivy plant.  Touching animals, other people, or objects that have come in contact with poison ivy and have the chemical on them. What increases the risk? This condition is more likely to develop in people who:  Are outdoors often in [place]ed or [place] areas.  Go outdoors without wearing protective clothing, such as closed shoes, long pants, and a long-sleeved shirt. What are the signs or symptoms? Symptoms of this condition include:  Redness of  the skin.  Extreme itching.  A rash that often includes bumps and blisters. The rash usually appears 48 hours after exposure, if you have been exposed before. If this is the first time you have been exposed, the rash may not appear until a week after exposure.  Swelling. This may occur if the reaction is more severe. Symptoms usually last for 1-2 weeks. However, the first time you develop this condition, symptoms may last 3-4 weeks. How is this diagnosed? This condition may be diagnosed based on your symptoms and a physical exam. Your health care provider may also ask you about any recent outdoor activity. How is this treated? Treatment for this condition will vary depending on how severe it is. Treatment may include:  Hydrocortisone cream or calamine lotion to relieve itching.  Oatmeal baths to soothe the skin.  Medicines, such as over-the-counter antihistamine tablets.  Oral steroid medicine, for more severe reactions. Follow these instructions at home: Medicines  Take or apply over-the-counter and prescription medicines only as told by your health care provider.  Use hydrocortisone cream or calamine lotion as needed to soothe the skin and relieve itching. General instructions  Do not scratch or rub your skin.  Apply a cold, wet cloth (cold compress) to the affected areas or take baths in cool water. This will help with itching. Avoid hot baths and showers.  Take oatmeal baths as needed. Use colloidal oatmeal. You can get this at your local pharmacy  or grocery store. Follow the instructions on the packaging.  While you have the rash, wash clothes right after you wear them.  Keep all follow-up visits as told by your health care provider. This is important. How is this prevented?   Learn to identify the poison ivy plant and avoid contact with the plant. This plant can be recognized by the number of leaves. Generally, poison ivy has three leaves with flowering branches on a  single stem. The leaves are typically glossy, and they have jagged edges that come to a point at the front.  If you have been exposed to poison ivy, thoroughly wash with soap and water right away. You have about 30 minutes to remove the plant resin before it will cause the rash. Be sure to wash under your fingernails, because any plant resin there will continue to spread the rash.  When hiking or camping, wear clothes that will help you to avoid exposure on the skin. This includes long pants, a long-sleeved shirt, tall socks, and hiking boots. You can also apply preventive lotion to your skin to help limit exposure.  If you suspect that your clothes or outdoor gear came in contact with poison ivy, rinse them off outside with a garden hose before you bring them inside your house.  When doing yard work or gardening, wear gloves, long sleeves, long pants, and boots. Wash your garden tools and gloves if they come in contact with poison ivy.  If you suspect that your pet has come into contact with poison ivy, wash him or her with pet shampoo and water. Make sure to wear gloves while washing your pet. Contact a health care provider if you have:  Open sores in the rash area.  More redness, swelling, or pain in the affected area.  Redness that spreads beyond the rash area.  Fluid, blood, or pus coming from the affected area.  A fever.  A rash over a large area of your body.  A rash on your eyes, mouth, or genitals.  A rash that does not improve after a few weeks. Get help right away if:  Your face swells or your eyes swell shut.  You have trouble breathing.  You have trouble swallowing. These symptoms may represent a serious problem that is an emergency. Do not wait to see if the symptoms will go away. Get medical help right away. Call your local emergency services (911 in the [place] Do not drive yourself to the hospital. Summary  Poison ivy dermatitis is inflammation of the skin that  is caused by chemicals in the leaves of the poison ivy plant.  Symptoms of this condition include redness, itching, a rash, and swelling.  Do not scratch or rub your skin.  Take or apply over-the-counter and prescription medicines only as told by your health care provider. This information is not intended to replace advice given to you by your health care provider. Make sure you discuss any questions you have with your health care provider. Document Released: 07/31/2000 Document Revised: 11/25/2018 Document Reviewed: 07/29/2018 Elsevier Patient Education  2020 [employer].
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
## 2019-05-07 ENCOUNTER — Encounter: Payer: Self-pay | Admitting: Nurse Practitioner

## 2019-05-15 ENCOUNTER — Ambulatory Visit: Payer: BC Managed Care – PPO | Admitting: Nurse Practitioner

## 2019-05-22 ENCOUNTER — Ambulatory Visit (INDEPENDENT_AMBULATORY_CARE_PROVIDER_SITE_OTHER): Payer: BC Managed Care – PPO | Admitting: Family Medicine

## 2019-05-22 ENCOUNTER — Encounter: Payer: Self-pay | Admitting: Family Medicine

## 2019-05-22 ENCOUNTER — Other Ambulatory Visit: Payer: Self-pay

## 2019-05-22 VITALS — BP 119/65 | HR 84 | Temp 98.1°F | Resp 16 | Ht 67.0 in | Wt 221.0 lb

## 2019-05-22 DIAGNOSIS — G8929 Other chronic pain: Secondary | ICD-10-CM | POA: Diagnosis not present

## 2019-05-22 DIAGNOSIS — Z23 Encounter for immunization: Secondary | ICD-10-CM | POA: Diagnosis not present

## 2019-05-22 DIAGNOSIS — M25552 Pain in left hip: Secondary | ICD-10-CM

## 2019-05-22 MED ORDER — LIDOCAINE HCL (PF) 1 % IJ SOLN
4.0000 mL | Freq: Once | INTRAMUSCULAR | Status: AC
  Administered 2019-05-22: 4 mL

## 2019-05-22 MED ORDER — BACLOFEN 10 MG PO TABS: 5.0000 mg | ORAL_TABLET | Freq: Every evening | ORAL | 0 refills | Status: DC | PRN

## 2019-05-22 MED ORDER — METHYLPREDNISOLONE ACETATE 40 MG/ML IJ SUSP
40.0000 mg | Freq: Once | INTRAMUSCULAR | Status: AC
  Administered 2019-05-22: 40 mg via INTRA_ARTICULAR

## 2019-05-22 NOTE — Patient Instructions (Addendum)
Thank you for coming to the office today.  You received a Left Hip Joint steroid injection today. - Lidocaine numbing medicine may ease the pain initially for a few hours until it wears off - As discussed, you may experience a "steroid flare" this evening or within 24-48 hours, anytime medicine is injected into an inflamed joint it can cause the pain to get worse temporarily - Everyone responds differently to these injections, it depends on the patient and the severity of the joint problem, it may provide anywhere from days to weeks, to months of relief. Ideal response is >6 months relief - Try to take it easy for next 1-2 days, avoid over activity and strain on joint (limit walking for hip) - Recommend the following:   - For swelling - rest, compression sleeve / ACE wrap, elevation, and ice packs as needed for first few days   - For pain in future may use heating pad or moist heat as needed  Medication  Start taking Baclofen (Lioresal) 10mg  (muscle relaxant) - start with half (cut) to one whole pill at night as needed for next 1-3 nights (may make you drowsy, caution with driving) see how it affects you, then if tolerated increase to one pill 2 to 3 times a day or (every 8 hours as needed)  If not improving as expected over next several weeks, please follow-up sooner for re-evaluation. Or we can refer to Orthopedics   Please schedule a Follow-up Appointment to: Return in about 4 weeks (around 06/19/2019), or if symptoms worsen or fail to improve, for for Left Hip Pain.  If you have any other questions or concerns, please feel free to call the office or send a message through [place]. You may also schedule an earlier appointment if necessary.  Additionally, you may be receiving a survey about your experience at our office within a few days to 1 week by e-mail or mail. We value your feedback.  Nobie Putnam, DO [facility], [facility]   Hip Bursitis Rehab Ask your health  care provider which exercises are safe for you. Do exercises exactly as told by your health care provider and adjust them as directed. It is normal to feel mild stretching, pulling, tightness, or discomfort as you do these exercises. Stop right away if you feel sudden pain or your pain gets worse. Do not begin these exercises until told by your health care provider. Stretching exercise This exercise warms up your muscles and joints and improves the movement and flexibility of your hip. This exercise also helps to relieve pain and stiffness. Iliotibial band stretch An iliotibial band is a strong band of muscle tissue that runs from the outer side of your hip to the outer side of your thigh and knee. 1. Lie on your side with your left / right leg in the top position. 2. Bend your left / right knee and grab your ankle. Stretch out your bottom arm to help you balance. 3. Slowly bring your knee back so your thigh is behind your body. 4. Slowly lower your knee toward the floor until you feel a gentle stretch on the outside of your left / right thigh. If you do not feel a stretch and your knee will not fall farther, place the heel of your other foot on top of your knee and pull your knee down toward the floor with your foot. 5. Hold this position for __________ seconds. 6. Slowly return to the starting position. Repeat __________ times.  Complete this exercise __________ times a day. Strengthening exercises These exercises build strength and endurance in your hip and pelvis. Endurance is the ability to use your muscles for a long time, even after they get tired. Bridge This exercise strengthens the muscles that move your thigh backward (hip extensors). 1. Lie on your back on a firm surface with your knees bent and your feet flat on the floor. 2. Tighten your buttocks muscles and lift your buttocks off the floor until your trunk is level with your thighs. ? Do not arch your back. ? You should feel the  muscles working in your buttocks and the back of your thighs. If you do not feel these muscles, slide your feet 1-2 inches (2.5-5 cm) farther away from your buttocks. ? If this exercise is too easy, try doing it with your arms crossed over your chest. 3. Hold this position for __________ seconds. 4. Slowly lower your hips to the starting position. 5. Let your muscles relax completely after each repetition. Repeat __________ times. Complete this exercise __________ times a day. Squats This exercise strengthens the muscles in front of your thigh and knee (quadriceps). 1. Stand in front of a table, with your feet and knees pointing straight ahead. You may rest your hands on the table for balance but not for support. 2. Slowly bend your knees and lower your hips like you are going to sit in a chair. ? Keep your weight over your heels, not over your toes. ? Keep your lower legs upright so they are parallel with the table legs. ? Do not let your hips go lower than your knees. ? Do not bend lower than told by your health care provider. ? If your hip pain increases, do not bend as low. 3. Hold the squat position for __________ seconds. 4. Slowly push with your legs to return to standing. Do not use your hands to pull yourself to standing. Repeat __________ times. Complete this exercise __________ times a day. Hip hike 1. Stand sideways on a bottom step. Stand on your left / right leg with your other foot unsupported next to the step. You can hold on to the railing or wall for balance if needed. 2. Keep your knees straight and your torso square. Then lift your left / right hip up toward the ceiling. 3. Hold this position for __________ seconds. 4. Slowly let your left / right hip lower toward the floor, past the starting position. Your foot should get closer to the floor. Do not lean or bend your knees. Repeat __________ times. Complete this exercise __________ times a day. Single leg  stand 1. Without shoes, stand near a railing or in a doorway. You may hold on to the railing or door frame as needed for balance. 2. Squeeze your left / right buttock muscles, then lift up your other foot. ? Do not let your left / right hip push out to the side. ? It is helpful to stand in front of a mirror for this exercise so you can watch your hip. 3. Hold this position for __________ seconds. Repeat __________ times. Complete this exercise __________ times a day. This information is not intended to replace advice given to you by your health care provider. Make sure you discuss any questions you have with your health care provider. Document Released: 09/10/2004 Document Revised: 11/28/2018 Document Reviewed: 11/28/2018 Elsevier Patient Education  2020 [employer].
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
## 2019-06-04 ENCOUNTER — Encounter: Payer: Self-pay | Admitting: Emergency Medicine

## 2019-06-04 ENCOUNTER — Other Ambulatory Visit: Payer: Self-pay

## 2019-06-04 ENCOUNTER — Ambulatory Visit
Admission: EM | Admit: 2019-06-04 | Discharge: 2019-06-04 | Disposition: A | Discharge status: Home / Self Care | Payer: BC Managed Care – PPO | Attending: Family Medicine | Admitting: Family Medicine

## 2019-06-04 DIAGNOSIS — R05 Cough: Secondary | ICD-10-CM | POA: Diagnosis not present

## 2019-06-04 DIAGNOSIS — R110 Nausea: Secondary | ICD-10-CM

## 2019-06-04 DIAGNOSIS — B349 Viral infection, unspecified: Secondary | ICD-10-CM

## 2019-06-04 DIAGNOSIS — M791 Myalgia, unspecified site: Secondary | ICD-10-CM | POA: Diagnosis not present

## 2019-06-04 DIAGNOSIS — R197 Diarrhea, unspecified: Secondary | ICD-10-CM

## 2019-06-04 DIAGNOSIS — R509 Fever, unspecified: Secondary | ICD-10-CM

## 2019-06-04 MED ORDER — ONDANSETRON 8 MG PO TBDP: 8.0000 mg | ORAL_TABLET | Freq: Three times a day (TID) | ORAL | 0 refills | Status: DC | PRN

## 2019-06-04 NOTE — ED Triage Notes (Signed)
Patient c/o cough, congestion, fever and bodyaches for a week.  Patient reports N/D that started 3 days ago.

## 2019-06-04 NOTE — ED Provider Notes (Signed)
[facility]    CSN: [id number] Arrival date & time: 06/04/19  1552      History   Chief Complaint Chief Complaint  Patient presents with  . Cough  . Fever  . Generalized Body Aches  . Diarrhea    HPI Angela Quinn is a 58 y.o. female.   58 yo female with a c/o cough, congestion, fevers, bodyaches, for one week and nausea and diarrhea for 3 days. Denies any chest pains, shortness of breath.      Past Medical History:  Diagnosis Date  . GERD (gastroesophageal reflux disease)   . Urine incontinence     Patient Active Problem List   Diagnosis Date Noted  . Elevated BP without diagnosis of hypertension 11/10/2018  . Anxiety 05/09/2018  . Prediabetes 01/03/2018  . Urinary, incontinence, stress female 11/27/2016  . Vitamin D insufficiency 07/29/2016  . Abnormal MRI, cervical spine 04/01/2016  . Urine incontinence 02/07/2016  . GERD (gastroesophageal reflux disease) 02/07/2016  . Post-menopausal bleeding 02/07/2016    Past Surgical History:  Procedure Laterality Date  . EYE SURGERY    . TUBAL LIGATION      OB History    Gravida  4   Para  4   Term  4   Preterm      AB      Living  4     SAB      TAB      Ectopic      Multiple      Live Births               Home Medications    Prior to Admission medications   Medication Sig Start Date End Date Taking? Authorizing Provider  metFORMIN (GLUCOPHAGE) 500 MG tablet Take 1 tablet (500 mg total) by mouth daily with breakfast. 03/20/19  Yes Mikey College, NP  baclofen (LIORESAL) 10 MG tablet Take 0.5-1 tablets (5-10 mg total) by mouth at bedtime as needed for muscle spasms. 05/22/19   Karamalegos, Devonne Doughty, DO  blood glucose meter kit and supplies Dispense based on patient and insurance preference. Use once daily as directed. 05/09/18   Mikey College, NP  diclofenac (VOLTAREN) 75 MG EC tablet TAKE 1 TABLET TWICE DAILY FOR 14 DAYS. THEN TAKE 1 TABLET TWICE DAILY AS  NEEDED FOR KNEE PAIN. 03/20/19   Mikey College, NP  glucose blood test strip  05/17/18   [provider]  Microlet Lancets [place]  05/09/18   [provider]  omeprazole (PRILOSEC) 40 MG capsule TAKE 1 CAPSULE BY MOUTH EVERY DAY 03/17/19   Mikey College, NP  ondansetron (ZOFRAN ODT) 4 MG disintegrating tablet Take 1 tablet (4 mg total) by mouth every 6 (six) hours as needed for nausea or vomiting. 06/06/19   Vanessa Kick, MD  ondansetron (ZOFRAN ODT) 8 MG disintegrating tablet Take 1 tablet (8 mg total) by mouth every 8 (eight) hours as needed. 06/04/19   Norval Gable, MD  traZODone (DESYREL) 50 MG tablet TAKE 0.5-1 TABLETS (25-50 MG TOTAL) BY MOUTH AT BEDTIME AS NEEDED FOR SLEEP. 11/29/18   Mikey College, NP    Family History Family History  Problem Relation Age of Onset  . Hypertension Mother   . Diabetes Mother   . Arthritis Mother   . Cancer Father        brain tumor    Social History Social History   Tobacco Use  . Smoking status: Former Smoker  Packs/day: 1.00    Years: 20.00    Pack years: 20.00    Types: Cigarettes  . Smokeless tobacco: Never Used  Substance Use Topics  . Alcohol use: No    Alcohol/week: 0.0 standard drinks  . Drug use: No     Allergies   Penicillins   Review of Systems Review of Systems   Physical Exam Triage Vital Signs ED Triage Vitals  Enc Vitals Group     BP 06/04/19 1609 123/86     Pulse Rate 06/04/19 1609 95     Resp 06/04/19 1609 16     Temp 06/04/19 1609 98.6 F (37 C)     Temp Source 06/04/19 1609 Oral     SpO2 06/04/19 1609 95 %     Weight 06/04/19 1607 210 lb (95.3 kg)     Height 06/04/19 1607 5' 6" (1.676 m)     Head Circumference --      Peak Flow --      Pain Score 06/04/19 1606 8     Pain Loc --      Pain Edu? --      Excl. in [place]? --    No data found.  Updated Vital Signs BP 123/86 (BP Location: Left Arm)   Pulse 95   Temp 98.6 F (37 C) (Oral)   Resp 16   Ht 5' 6"  (1.676 m)   Wt 95.3 kg   LMP 11/15/2016   SpO2 95%   BMI 33.89 kg/m   Visual Acuity Right Eye Distance:   Left Eye Distance:   Bilateral Distance:    Right Eye Near:   Left Eye Near:    Bilateral Near:     Physical Exam Vitals signs and nursing note reviewed.  Constitutional:      General: She is not in acute distress.    Appearance: She is not toxic-appearing or diaphoretic.  Cardiovascular:     Rate and Rhythm: Normal rate and regular rhythm.     Heart sounds: Normal heart sounds.  Pulmonary:     Effort: Pulmonary effort is normal. No respiratory distress.     Breath sounds: Normal breath sounds. No stridor. No wheezing, rhonchi or rales.  Abdominal:     General: Bowel sounds are normal. There is no distension.     Palpations: Abdomen is soft.     Tenderness: There is no abdominal tenderness. There is no guarding.  Neurological:     Mental Status: She is alert.      UC Treatments / Results  Labs (all labs ordered are listed, but only abnormal results are displayed) Labs Reviewed  NOVEL CORONAVIRUS, NAA (HOSP ORDER, SEND-OUT TO REF LAB; TAT 18-24 HRS) - Abnormal; Notable for the following components:      Result Value   SARS-CoV-2, NAA DETECTED (*)    All other components within normal limits    EKG   Radiology No results found.  Procedures Procedures (including critical care time)  Medications Ordered in UC Medications - No data to display  Initial Impression / Assessment and Plan / UC Course  I have reviewed the triage vital signs and the nursing notes.  Pertinent labs & imaging results that were available during my care of the patient were reviewed by me and considered in my medical decision making (see chart for details).      Final Clinical Impressions(s) / UC Diagnoses   Final diagnoses:  Viral syndrome    ED Prescriptions    Medication  Sig Dispense Auth. Provider   ondansetron (ZOFRAN ODT) 8 MG disintegrating tablet Take 1 tablet (8  mg total) by mouth every 8 (eight) hours as needed. 6 tablet Norval Gable, MD      1. diagnosis reviewed with patient 2. rx as per orders above; reviewed possible side effects, interactions, risks and benefits  3. Recommend supportive treatment with rest, fluids, otc analgesics prn 4. covid test done 5. Follow-up prn if symptoms worsen or don't improve  PDMP not reviewed this encounter.   Norval Gable, MD 06/06/19 2047

## 2019-06-05 LAB — NOVEL CORONAVIRUS, NAA (HOSP ORDER, SEND-OUT TO REF LAB; TAT 18-24 HRS): SARS-CoV-2, NAA: DETECTED — AB

## 2019-06-06 ENCOUNTER — Encounter (HOSPITAL_COMMUNITY): Payer: Self-pay

## 2019-06-06 ENCOUNTER — Telehealth (HOSPITAL_COMMUNITY): Payer: Self-pay | Admitting: Emergency Medicine

## 2019-06-06 MED ORDER — ONDANSETRON 4 MG PO TBDP: 4.0000 mg | ORAL_TABLET | Freq: Four times a day (QID) | ORAL | 0 refills | Status: DC | PRN

## 2019-06-06 NOTE — Telephone Encounter (Signed)
Pt returned call, given results, pt also requesting more zofran due to nausea. Okay to send per Dr. Mannie Stabile, 15 total, Q6H PRN odt. Pt verbalized understanding, all questions answered.

## 2019-06-06 NOTE — Telephone Encounter (Signed)
Positive covid, Attempted to reach patient. No answer at this time. Voicemail left.  Mychart message and comment sent.

## 2019-06-13 ENCOUNTER — Other Ambulatory Visit: Payer: Self-pay | Admitting: Family Medicine

## 2019-06-13 DIAGNOSIS — G8929 Other chronic pain: Secondary | ICD-10-CM

## 2019-06-16 ENCOUNTER — Other Ambulatory Visit: Payer: Self-pay

## 2019-06-16 DIAGNOSIS — Z20822 Contact with and (suspected) exposure to covid-19: Secondary | ICD-10-CM

## 2019-06-17 LAB — NOVEL CORONAVIRUS, NAA: SARS-CoV-2, NAA: NOT DETECTED

## 2019-06-28 ENCOUNTER — Other Ambulatory Visit: Payer: Self-pay | Admitting: Family Medicine

## 2019-06-28 DIAGNOSIS — G8929 Other chronic pain: Secondary | ICD-10-CM

## 2019-06-28 DIAGNOSIS — M25552 Pain in left hip: Secondary | ICD-10-CM

## 2019-07-04 ENCOUNTER — Ambulatory Visit: Payer: BC Managed Care – PPO | Admitting: Family Medicine

## 2019-07-04 ENCOUNTER — Other Ambulatory Visit: Payer: Self-pay

## 2019-07-04 ENCOUNTER — Encounter: Payer: Self-pay | Admitting: Family Medicine

## 2019-07-04 DIAGNOSIS — U071 COVID-19: Secondary | ICD-10-CM | POA: Diagnosis not present

## 2019-07-04 NOTE — Patient Instructions (Addendum)
Completed form for disability out of work Now you are back to full duty, follow-up as needed Form is ready for pick up at front office anytime starting Wednesday 07/05/19 - if you want to get it Thursday just come by or notify them in advance when you are arriving.  Please schedule a Follow-up Appointment to: Return in about 4 weeks (around 08/01/2019), or if symptoms worsen or fail to improve.  If you have any other questions or concerns, please feel free to call the office or send a message through [place]. You may also schedule an earlier appointment if necessary.  Additionally, you may be receiving a survey about your experience at our office within a few days to 1 week by e-mail or mail. We value your feedback.  Nobie Putnam, DO [place]

## 2019-07-04 NOTE — Progress Notes (Signed)
Virtual Visit via Telephone The purpose of this virtual visit is to provide medical care while limiting exposure to the novel coronavirus (COVID19) for both patient and office staff.  Consent was obtained for phone visit:  Yes.   Answered questions that patient had about telehealth interaction:  Yes.   I discussed the limitations, risks, security and privacy concerns of performing an evaluation and management service by telephone. I also discussed with the patient that there may be a patient responsible charge related to this service. The patient expressed understanding and agreed to proceed.  Patient Location: Home Provider Location: [facility])  ---------------------------------------------------------------------- Chief Complaint  Patient presents with  . Advice Only    Pt recently diagnose with COVID and had to be out of work 06/04/19 until 06/24/19. Pt still feeling fatigue and some SOB with excessive movement    S: Reviewed CMA documentation. I have called patient and gathered additional HPI as follows:  COVID19 Infection Initial symptoms onset 05/28/19, symptoms of COVID respiratory illness, went to Urgent [place] on 06/04/19 tested for viral illness with COVID tested POSITIVE at that time. Then treated and remained out of work, on 10/30 she went to [facility] drive through [id number] testing and result was NEGATIVE.  Today she says overall improved. 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 She feels overall that she is improving and able to work but still not 100% with some fatigue and occasional shortness of breath, nausea and unresolved abnormal sense of smell. - She has returned to fu  Patient is currently working  Denies any high risk travel to areas of current concern for [place]. Denies any known or suspected exposure to person with or possibly with COVID19.  Denies any fevers,  chills, sweats, body ache, cough, shortness of breath, sinus pain or pressure, headache, abdominal pain, diarrhea  Past Medical History:  Diagnosis Date  . GERD (gastroesophageal reflux disease)   . Urine incontinence    Social History   Tobacco Use  . Smoking status: Former Smoker    Packs/day: 1.00    Years: 20.00    Pack years: 20.00    Types: Cigarettes  . Smokeless tobacco: Never Used  Substance Use Topics  . Alcohol use: No    Alcohol/week: 0.0 standard drinks  . Drug use: No    Current Outpatient Medications:  .  baclofen (LIORESAL) 10 MG tablet, TAKE 0.5-1 TABLETS (5-10 MG TOTAL) BY MOUTH AT BEDTIME AS NEEDED FOR MUSCLE SPASMS., Disp: 90 tablet, Rfl: 1 .  blood glucose meter kit and supplies, Dispense based on patient and insurance preference. Use once daily as directed., Disp: 1 each, Rfl: 0 .  diclofenac (VOLTAREN) 75 MG EC tablet, TAKE 1 TABLET TWICE DAILY FOR 14 DAYS. THEN TAKE 1 TABLET TWICE DAILY AS NEEDED FOR KNEE PAIN., Disp: 60 tablet, Rfl: 2 .  glucose blood test strip, , Disp: , Rfl:  .  metFORMIN (GLUCOPHAGE) 500 MG tablet, Take 1 tablet (500 mg total) by mouth daily with breakfast., Disp: 90 tablet, Rfl: 1 .  Microlet Lancets MISC, , Disp: , Rfl:  .  omeprazole (PRILOSEC) 40 MG capsule, TAKE 1 CAPSULE BY MOUTH EVERY DAY, Disp: 90 capsule, Rfl: 1 .  ondansetron (ZOFRAN ODT) 4 MG disintegrating tablet, Take 1 tablet (4 mg total) by mouth every 6 (six) hours as needed for nausea or vomiting. (Patient not taking: Reported on 07/04/2019), Disp: 15 tablet,  Rfl: 0 .  ondansetron (ZOFRAN ODT) 8 MG disintegrating tablet, Take 1 tablet (8 mg total) by mouth every 8 (eight) hours as needed. (Patient not taking: Reported on 07/04/2019), Disp: 6 tablet, Rfl: 0  Depression screen [facility] 2/9 05/22/2019 11/07/2018 05/09/2018  Decreased Interest 0 0 0  Down, Depressed, Hopeless 0 0 0  PHQ - 2 Score 0 0 0  Altered sleeping - 3 1  Tired, decreased energy - 0 2  Change in appetite -  0 3  Feeling bad or failure about yourself  - 0 0  Trouble concentrating - 0 0  Moving slowly or fidgety/restless - 0 0  Suicidal thoughts - 0 0  PHQ-9 Score - 3 6  Difficult doing work/chores - Not difficult at all Not difficult at all    GAD 7 : Generalized Anxiety Score 11/07/2018 05/09/2018 02/07/2018 12/27/2017  Nervous, Anxious, on Edge 0 2 0 1  Control/stop worrying '1 2 1 3  '$ Worry too much - different things '1 2 1 3  '$ Trouble relaxing 1 2 0 2  Restless 1 1 0 3  Easily annoyed or irritable '2 3 1 3  '$ Afraid - awful might happen 0 0 0 0  Total GAD 7 Score '6 12 3 15  '$ Anxiety Difficulty Somewhat difficult Somewhat difficult Somewhat difficult Somewhat difficult    -------------------------------------------------------------------------- O: No physical exam performed due to remote telephone encounter.  Lab results reviewed.  Recent Results (from the past 2160 hour(s))  Novel Coronavirus, NAA (Hosp order, Send-out to Ref Lab; TAT 18-24 hrs     Status: Abnormal   Collection Time: 06/04/19  4:10 PM   Specimen: Nasopharyngeal Swab; Respiratory  Result Value Ref Range   SARS-CoV-2, NAA DETECTED (A) NOT DETECTED    Comment: (NOTE)                  Client Requested Flag This nucleic acid amplification test was developed and its performance characteristics determined by [employer]. Nucleic acid amplification tests include PCR and TMA. This test has not been FDA cleared or approved. This test has been authorized by FDA under an Emergency Use Authorization (EUA). This test is only authorized for the duration of time the declaration that circumstances exist justifying the authorization of the emergency use of in vitro diagnostic tests for detection of SARS-CoV-2 virus and/or diagnosis of COVID-19 infection under section 564(b)(1) of the Act, 21 U.S.C. 563SLH-7(D) (1), unless the authorization is terminated or revoked sooner. When diagnostic testing is negative, the possibility  of a false negative result should be considered in the context of a patient's recent exposures and the presence of clinical signs and symptoms consistent with COVID-19. An individual without symptoms of COVID-  19 and who is not shedding SARS-CoV-2 virus would expect to have a negative (not detected) result in this assay. Performed At: [facility] [address] [place], [place] [id number] Rush Farmer MD BW:[id number]    Coronavirus Source NASOPHARYNGEAL     Comment: Performed at [facility], [address] [place], [place] [address]  Novel Coronavirus, NAA ([facility])     Status: None   Collection Time: 06/16/19 11:30 AM   Specimen: Oropharyngeal(OP) collection in vial transport medium   OROPHARYNGEA  TESTING  Result Value Ref Range   SARS-CoV-2, NAA Not Detected Not Detected    Comment: Testing was performed using the cobas(R) SARS-CoV-2 test. This nucleic acid amplification test was developed and its performance characteristics determined by [employer]. Nucleic acid  amplification tests include PCR and TMA. This test has not been FDA cleared or approved. This test has been authorized by FDA under an Emergency Use Authorization (EUA). This test is only authorized for the duration of time the declaration that circumstances exist justifying the authorization of the emergency use of in vitro diagnostic tests for detection of SARS-CoV-2 virus and/or diagnosis of COVID-19 infection under section 564(b)(1) of the Act, 21 U.S.C. 063KZS-0(F) (1), unless the authorization is terminated or revoked sooner. When diagnostic testing is negative, the possibility of a false negative result should be considered in the context of a patient's recent exposures and the presence of clinical signs and symptoms consistent with COVID-19. An individual without symptoms  of COVID-19 and who is not shedding SARS-CoV-2 virus would expect to have a negative (not detected)  result in this assay.     -------------------------------------------------------------------------- A&P:  Problem List Items Addressed This Visit    None    Visit Diagnoses    COVID-19 virus infection    -  Primary     COVID19 Infection - Resolving Recent confirmed positive COVID 06/04/19, treated and tested negative on 06/16/19  Still some residual symptoms not 100% resolved, has some fatigue / dyspnea at times but minimal and resolving now, also has some lingering sense of taste/smell abnormality  Returned to work full regular duty on 06/24/19  Completed disability form for patient out of work. See form for full documentation. She is able to perform regular duty without restrictions since 06/24/19 when she returned to work. - Signed form to be picked up by patient on Thursday 07/06/19 and she will submit it to her employer   No orders of the defined types were placed in this encounter.   Follow-up: - Return in 1 month as needed  Patient verbalizes understanding with the above medical recommendations including the limitation of remote medical advice.  Specific follow-up and call-back criteria were given for patient to follow-up or seek medical care more urgently if needed.   - Time spent in direct consultation with patient on phone: 15 minutes   Nobie Putnam, [facility] 07/04/2019, 2:34 PM

## 2019-08-19 ENCOUNTER — Ambulatory Visit
Admission: EM | Admit: 2019-08-19 | Discharge: 2019-08-19 | Disposition: A | Discharge status: Home / Self Care | Payer: BC Managed Care – PPO | Attending: Family Medicine | Admitting: Family Medicine

## 2019-08-19 ENCOUNTER — Encounter: Payer: Self-pay | Admitting: Emergency Medicine

## 2019-08-19 ENCOUNTER — Other Ambulatory Visit: Payer: Self-pay

## 2019-08-19 DIAGNOSIS — L255 Unspecified contact dermatitis due to plants, except food: Secondary | ICD-10-CM | POA: Diagnosis not present

## 2019-08-19 HISTORY — DX: Type 2 diabetes mellitus without complications: E11.9

## 2019-08-19 MED ORDER — PREDNISONE 10 MG PO TABS: ORAL_TABLET | ORAL | 0 refills | Status: DC

## 2019-08-19 NOTE — ED Provider Notes (Signed)
[facility]    CSN: [id number] Arrival date & time: 08/19/19  0955  History   Chief Complaint Chief Complaint  Patient presents with  . Rash   HPI  59 year old female presents with rash.  Patient states that she was recently exposed to poison oak or poison ivy.  She was removing a fence.  She states that she subsequently developed rash.  This occurred about a week ago.  She reports itchy rash on her extremities and trunk.  She believes that she has poison oak or poison ivy.  No relieving factors.  No known exacerbating factors.  No other associated symptoms.  No other complaints.  PMH, Surgical Hx, Family Hx, Social History reviewed and updated as below.  Past Medical History:  Diagnosis Date  . Diabetes mellitus without complication ([place])   . GERD (gastroesophageal reflux disease)   . Urine incontinence    Patient Active Problem List   Diagnosis Date Noted  . Elevated BP without diagnosis of hypertension 11/10/2018  . Anxiety 05/09/2018  . Prediabetes 01/03/2018  . Urinary, incontinence, stress female 11/27/2016  . Vitamin D insufficiency 07/29/2016  . Abnormal MRI, cervical spine 04/01/2016  . Urine incontinence 02/07/2016  . GERD (gastroesophageal reflux disease) 02/07/2016  . Post-menopausal bleeding 02/07/2016    Past Surgical History:  Procedure Laterality Date  . EYE SURGERY    . TUBAL LIGATION      OB History    Gravida  4   Para  4   Term  4   Preterm      AB      Living  4     SAB      TAB      Ectopic      Multiple      Live Births               Home Medications    Prior to Admission medications   Medication Sig Start Date End Date Taking? Authorizing Provider  baclofen (LIORESAL) 10 MG tablet TAKE 0.5-1 TABLETS (5-10 MG TOTAL) BY MOUTH AT BEDTIME AS NEEDED FOR MUSCLE SPASMS. 06/28/19  Yes Karamalegos, Devonne Doughty, DO  blood glucose meter kit and supplies Dispense based on patient and insurance preference. Use once  daily as directed. 05/09/18  Yes Mikey College, NP  diclofenac (VOLTAREN) 75 MG EC tablet TAKE 1 TABLET TWICE DAILY FOR 14 DAYS. THEN TAKE 1 TABLET TWICE DAILY AS NEEDED FOR KNEE PAIN. 03/20/19  Yes Mikey College, NP  glucose blood test strip  05/17/18  Yes [provider]  metFORMIN (GLUCOPHAGE) 500 MG tablet Take 1 tablet (500 mg total) by mouth daily with breakfast. 03/20/19  Yes Mikey College, NP  Microlet Lancets [place]  05/09/18  Yes [provider]  omeprazole (PRILOSEC) 40 MG capsule TAKE 1 CAPSULE BY MOUTH EVERY DAY 03/17/19  Yes Mikey College, NP  predniSONE (DELTASONE) 10 MG tablet 50 mg daily x 3 days, then 40 mg daily x 3 days, then 30 mg daily x 3 days, then 20 mg daily x 3 days, then 10 mg daily x 3 days. 08/19/19   Coral Spikes, DO    Family History Family History  Problem Relation Age of Onset  . Hypertension Mother   . Diabetes Mother   . Arthritis Mother   . Cancer Father        brain tumor    Social History Social History   Tobacco Use  . Smoking status:  Former Smoker    Packs/day: 1.00    Years: 20.00    Pack years: 20.00    Types: Cigarettes    Quit date: 08/19/1999    Years since quitting: 20.0  . Smokeless tobacco: Never Used  Substance Use Topics  . Alcohol use: No    Alcohol/week: 0.0 standard drinks  . Drug use: No     Allergies   Penicillins   Review of Systems Review of Systems  Constitutional: Negative.   Skin: Positive for rash.   Physical Exam Triage Vital Signs ED Triage Vitals  Enc Vitals Group     BP 08/19/19 1004 (!) 142/87     Pulse Rate 08/19/19 1004 81     Resp 08/19/19 1004 18     Temp 08/19/19 1004 98 F (36.7 C)     Temp Source 08/19/19 1004 Oral     SpO2 08/19/19 1004 97 %     Weight 08/19/19 1003 225 lb (102.1 kg)     Height 08/19/19 1003 '5\' 6"'$  (1.676 m)     Head Circumference --      Peak Flow --      Pain Score 08/19/19 1003 0     Pain Loc --      Pain Edu? --      Excl.  in [place]? --    Updated Vital Signs BP (!) 142/87 (BP Location: Left Arm)   Pulse 81   Temp 98 F (36.7 C) (Oral)   Resp 18   Ht '5\' 6"'$  (1.676 m)   Wt 102.1 kg   LMP 11/15/2016   SpO2 97%   BMI 36.32 kg/m   Visual Acuity Right Eye Distance:   Left Eye Distance:   Bilateral Distance:    Right Eye Near:   Left Eye Near:    Bilateral Near:     Physical Exam Vitals and nursing note reviewed.  Constitutional:      General: She is not in acute distress.    Appearance: Normal appearance. She is not ill-appearing.  HENT:     Head: Normocephalic and atraumatic.  Eyes:     General:        Right eye: No discharge.        Left eye: No discharge.     Conjunctiva/sclera: Conjunctivae normal.  Cardiovascular:     Rate and Rhythm: Normal rate and regular rhythm.     Heart sounds: No murmur.  Pulmonary:     Effort: Pulmonary effort is normal.     Breath sounds: Normal breath sounds. No wheezing, rhonchi or rales.  Skin:    Comments: Erythematous, vesicular rash noted on the arms and trunk.  Neurological:     Mental Status: She is alert.  Psychiatric:        Mood and Affect: Mood normal.        Behavior: Behavior normal.    UC Treatments / Results  Labs (all labs ordered are listed, but only abnormal results are displayed) Labs Reviewed - No data to display  EKG   Radiology No results found.  Procedures Procedures (including critical care time)  Medications Ordered in UC Medications - No data to display  Initial Impression / Assessment and Plan / UC Course  I have reviewed the triage vital signs and the nursing notes.  Pertinent labs & imaging results that were available during my care of the patient were reviewed by me and considered in my medical decision making (see chart for details).  59 year old female presents with dermatitis secondary to poison oak , poison ivy or poison sumac.  Prednisone as directed.  Final Clinical Impressions(s) / UC Diagnoses    Final diagnoses:  Dermatitis due to plants, including poison ivy, sumac, and oak   Discharge Instructions   None    ED Prescriptions    Medication Sig Dispense Auth. Provider   predniSONE (DELTASONE) 10 MG tablet 50 mg daily x 3 days, then 40 mg daily x 3 days, then 30 mg daily x 3 days, then 20 mg daily x 3 days, then 10 mg daily x 3 days. 45 tablet Coral Spikes, DO     PDMP not reviewed this encounter.   Coral Spikes, DO 08/19/19 1045

## 2019-08-19 NOTE — ED Triage Notes (Signed)
Patient in today c/o rash x 1 week. Patient pulled some poison ivy off a fence.

## 2019-10-02 ENCOUNTER — Ambulatory Visit
Admission: EM | Admit: 2019-10-02 | Discharge: 2019-10-02 | Disposition: A | Discharge status: Home / Self Care | Payer: BC Managed Care – PPO | Attending: Family Medicine | Admitting: Family Medicine

## 2019-10-02 ENCOUNTER — Other Ambulatory Visit: Payer: Self-pay

## 2019-10-02 ENCOUNTER — Encounter: Payer: Self-pay | Admitting: Emergency Medicine

## 2019-10-02 DIAGNOSIS — H9313 Tinnitus, bilateral: Secondary | ICD-10-CM

## 2019-10-02 DIAGNOSIS — H65193 Other acute nonsuppurative otitis media, bilateral: Secondary | ICD-10-CM

## 2019-10-02 MED ORDER — CETIRIZINE HCL 10 MG PO TABS: 10.0000 mg | ORAL_TABLET | Freq: Every day | ORAL | 0 refills | Status: DC

## 2019-10-02 MED ORDER — FLUTICASONE PROPIONATE 50 MCG/ACT NA SUSP: 1.0000 | Freq: Every day | NASAL | 2 refills | Status: DC

## 2019-10-02 MED ORDER — PREDNISONE 10 MG (21) PO TBPK: ORAL_TABLET | Freq: Every day | ORAL | 0 refills | Status: DC

## 2019-10-02 NOTE — ED Triage Notes (Signed)
Pt c/o bilateral tinnitus but the right is worse than the left. Started about a 2 weeks ago. She states that her ears also feel full but do not hurt. She states that they would clear up for a couple of days then they would feel full again.

## 2019-10-02 NOTE — Discharge Instructions (Signed)
We will try a course of prednisone as well as a daily antihistamine and nasal spray.  If symptoms worsen or do not improve in the next week to return to be seen or to follow up with ear nose and throat or your primary care provider.

## 2019-10-02 NOTE — ED Provider Notes (Signed)
[facility]    CSN: [id number] Arrival date & time: 10/02/19  1119      History   Chief Complaint Chief Complaint  Patient presents with  . Tinnitus    HPI Angela Quinn is a 59 y.o. female.   Llana Deshazo presents with complaints of  Bilateral ears feeling "clogged" as well as humming to right ear and ringing to left ear. This has been ongoing for a few weeks now. No other cough, congestion, sore throat, headache. Some decrease in her hearing. Not necessarily painful but "full" feeling. Denies any previous similar. Hasn't tried any medications for her symptoms. No jaw pain. Her right eye feels "squinty" at times without any other eye redness, drainage, difficulty blinking or movement. She is a Administrator.    ROS per HPI, negative if not otherwise mentioned.      Past Medical History:  Diagnosis Date  . Diabetes mellitus without complication ([place])   . GERD (gastroesophageal reflux disease)   . Urine incontinence     Patient Active Problem List   Diagnosis Date Noted  . Elevated BP without diagnosis of hypertension 11/10/2018  . Anxiety 05/09/2018  . Prediabetes 01/03/2018  . Urinary, incontinence, stress female 11/27/2016  . Vitamin D insufficiency 07/29/2016  . Abnormal MRI, cervical spine 04/01/2016  . Urine incontinence 02/07/2016  . GERD (gastroesophageal reflux disease) 02/07/2016  . Post-menopausal bleeding 02/07/2016    Past Surgical History:  Procedure Laterality Date  . EYE SURGERY    . TUBAL LIGATION      OB History    Gravida  4   Para  4   Term  4   Preterm      AB      Living  4     SAB      TAB      Ectopic      Multiple      Live Births               Home Medications    Prior to Admission medications   Medication Sig Start Date End Date Taking? Authorizing Provider  baclofen (LIORESAL) 10 MG tablet TAKE 0.5-1 TABLETS (5-10 MG TOTAL) BY MOUTH AT BEDTIME AS NEEDED FOR MUSCLE SPASMS. 06/28/19  Yes  Karamalegos, Devonne Doughty, DO  blood glucose meter kit and supplies Dispense based on patient and insurance preference. Use once daily as directed. 05/09/18  Yes Mikey College, NP  diclofenac (VOLTAREN) 75 MG EC tablet TAKE 1 TABLET TWICE DAILY FOR 14 DAYS. THEN TAKE 1 TABLET TWICE DAILY AS NEEDED FOR KNEE PAIN. 03/20/19  Yes Mikey College, NP  glucose blood test strip  05/17/18  Yes [provider]  metFORMIN (GLUCOPHAGE) 500 MG tablet Take 1 tablet (500 mg total) by mouth daily with breakfast. 03/20/19  Yes Mikey College, NP  Microlet Lancets [place]  05/09/18  Yes [provider]  omeprazole (PRILOSEC) 40 MG capsule TAKE 1 CAPSULE BY MOUTH EVERY DAY 03/17/19  Yes Mikey College, NP  cetirizine (ZYRTEC) 10 MG tablet Take 1 tablet (10 mg total) by mouth daily. 10/02/19   Zigmund Gottron, NP  fluticasone (FLONASE) 50 MCG/ACT nasal spray Place 1 spray into both nostrils daily. 10/02/19   Zigmund Gottron, NP  predniSONE (STERAPRED UNI-PAK 21 TAB) 10 MG (21) TBPK tablet Take by mouth daily. Per box instruction 10/02/19   Zigmund Gottron, NP    Family History Family History  Problem Relation Age of  Onset  . Hypertension Mother   . Diabetes Mother   . Arthritis Mother   . Cancer Father        brain tumor    Social History Social History   Tobacco Use  . Smoking status: Former Smoker    Packs/day: 1.00    Years: 20.00    Pack years: 20.00    Types: Cigarettes    Quit date: 08/19/1999    Years since quitting: 20.1  . Smokeless tobacco: Never Used  Substance Use Topics  . Alcohol use: No    Alcohol/week: 0.0 standard drinks  . Drug use: No     Allergies   Penicillins   Review of Systems Review of Systems   Physical Exam Triage Vital Signs ED Triage Vitals  Enc Vitals Group     BP 10/02/19 1137 135/87     Pulse Rate 10/02/19 1137 64     Resp 10/02/19 1137 18     Temp 10/02/19 1137 97.9 F (36.6 C)     Temp Source 10/02/19 1137 Oral      SpO2 10/02/19 1137 97 %     Weight 10/02/19 1134 220 lb (99.8 kg)     Height 10/02/19 1134 '5\' 7"'$  (1.702 m)     Head Circumference --      Peak Flow --      Pain Score 10/02/19 1134 0     Pain Loc --      Pain Edu? --      Excl. in [place]? --    No data found.  Updated Vital Signs BP 135/87 (BP Location: Right Arm)   Pulse 64   Temp 97.9 F (36.6 C) (Oral)   Resp 18   Ht '5\' 7"'$  (1.702 m)   Wt 220 lb (99.8 kg)   LMP 11/15/2016   SpO2 97%   BMI 34.46 kg/m    Physical Exam Constitutional:      General: She is not in acute distress.    Appearance: She is well-developed.  HENT:     Head: Normocephalic and atraumatic.     Right Ear: No swelling or tenderness. A middle ear effusion is present. There is no impacted cerumen. No foreign body. Tympanic membrane is not injected, erythematous or bulging.     Left Ear: No swelling or tenderness. A middle ear effusion is present. There is no impacted cerumen. No foreign body. Tympanic membrane is not injected, erythematous or bulging.     Ears:     Comments: Mild effusions bilaterally noted without indications of infection present; mild redness to right ear bony structures while TM appears WNL Cardiovascular:     Rate and Rhythm: Normal rate.  Pulmonary:     Effort: Pulmonary effort is normal.  Skin:    General: Skin is warm and dry.  Neurological:     Mental Status: She is alert and oriented to person, place, and time.     Cranial Nerves: No facial asymmetry.     Comments: Noted some squinting of right eye intermittently while speaking, but this can resolve and be WNL at rest       UC Treatments / Results  Labs (all labs ordered are listed, but only abnormal results are displayed) Labs Reviewed - No data to display  EKG   Radiology No results found.  Procedures Procedures (including critical care time)  Medications Ordered in UC Medications - No data to display  Initial Impression / Assessment and Plan / UC Course  I have reviewed the triage vital signs and the nursing notes.  Pertinent labs & imaging results that were available during my care of the patient were reviewed by me and considered in my medical decision making (see chart for details).     No obvious source of symptoms although with some effusion noted bilaterally. Will try prednisone, flonase, zyrtec, with recommendations to follow up with ENT for persistent symptoms. Return precautions provided. Patient verbalized understanding and agreeable to plan.  Ambulatory out of clinic without difficulty.    Final Clinical Impressions(s) / UC Diagnoses   Final diagnoses:  Acute effusion of both middle ears  Tinnitus of both ears     Discharge Instructions     We will try a course of prednisone as well as a daily antihistamine and nasal spray.  If symptoms worsen or do not improve in the next week to return to be seen or to follow up with ear nose and throat or your primary care provider.    ED Prescriptions    Medication Sig Dispense Auth. Provider   predniSONE (STERAPRED UNI-PAK 21 TAB) 10 MG (21) TBPK tablet Take by mouth daily. Per box instruction 21 tablet Krimson Massmann B, NP   fluticasone (FLONASE) 50 MCG/ACT nasal spray Place 1 spray into both nostrils daily. 16 g Augusto Gamble B, NP   cetirizine (ZYRTEC) 10 MG tablet Take 1 tablet (10 mg total) by mouth daily. 30 tablet Zigmund Gottron, NP     PDMP not reviewed this encounter.   Zigmund Gottron, NP 10/02/19 1300

## 2019-11-11 ENCOUNTER — Other Ambulatory Visit: Payer: Self-pay | Admitting: Nurse Practitioner

## 2019-11-11 DIAGNOSIS — R7303 Prediabetes: Secondary | ICD-10-CM

## 2020-01-14 ENCOUNTER — Other Ambulatory Visit: Payer: Self-pay | Admitting: Nurse Practitioner

## 2020-01-14 DIAGNOSIS — K219 Gastro-esophageal reflux disease without esophagitis: Secondary | ICD-10-CM

## 2020-01-14 NOTE — Telephone Encounter (Signed)
Requested Prescriptions  Pending Prescriptions Disp Refills  . omeprazole (PRILOSEC) 40 MG capsule [Pharmacy Med Name: OMEPRAZOLE DR 40 MG CAPSULE] 90 capsule 1    Sig: TAKE 1 CAPSULE BY MOUTH EVERY DAY     Gastroenterology: Proton Pump Inhibitors Passed - 01/14/2020 12:48 AM      Passed - Valid encounter within last 12 months    Recent Outpatient Visits          6 months ago COVID-19 virus infection   [facility] [place], Netta Neat, DO   7 months ago Chronic left hip pain   [facility] [place], Netta Neat, DO   8 months ago Poison ivy   [facility] Rudd, Alison Stalling, NP   10 months ago Prediabetes   [facility] Rudd, Alison Stalling, NP   1 year ago Psychophysiologic insomnia   [facility] Kyung Rudd, Alison Stalling, NP

## 2020-02-02 ENCOUNTER — Other Ambulatory Visit: Payer: Self-pay | Admitting: Nurse Practitioner

## 2020-02-02 DIAGNOSIS — K219 Gastro-esophageal reflux disease without esophagitis: Secondary | ICD-10-CM

## 2020-02-02 NOTE — Telephone Encounter (Signed)
Patient requesting 90 day supply omeprazole (PRILOSEC) 40 MG capsule , informed patient please allow 48 to 72 hour turn around time. Patient was unsure if an appointment was needed, please advise  [id number] - [place], [place] - [address] Phone:  [phone]  Fax:  [phone]

## 2020-07-29 ENCOUNTER — Other Ambulatory Visit: Payer: Self-pay

## 2020-07-29 ENCOUNTER — Ambulatory Visit (INDEPENDENT_AMBULATORY_CARE_PROVIDER_SITE_OTHER): Payer: BC Managed Care – PPO | Admitting: Family Medicine

## 2020-07-29 ENCOUNTER — Encounter: Payer: Self-pay | Admitting: Family Medicine

## 2020-07-29 VITALS — BP 135/81 | HR 70 | Temp 97.8°F | Resp 16 | Ht 64.0 in | Wt 228.6 lb

## 2020-07-29 DIAGNOSIS — E669 Obesity, unspecified: Secondary | ICD-10-CM | POA: Insufficient documentation

## 2020-07-29 DIAGNOSIS — Z1211 Encounter for screening for malignant neoplasm of colon: Secondary | ICD-10-CM

## 2020-07-29 DIAGNOSIS — N644 Mastodynia: Secondary | ICD-10-CM

## 2020-07-29 DIAGNOSIS — N6320 Unspecified lump in the left breast, unspecified quadrant: Secondary | ICD-10-CM

## 2020-07-29 DIAGNOSIS — K219 Gastro-esophageal reflux disease without esophagitis: Secondary | ICD-10-CM

## 2020-07-29 DIAGNOSIS — Z23 Encounter for immunization: Secondary | ICD-10-CM

## 2020-07-29 DIAGNOSIS — F419 Anxiety disorder, unspecified: Secondary | ICD-10-CM

## 2020-07-29 DIAGNOSIS — R7303 Prediabetes: Secondary | ICD-10-CM

## 2020-07-29 MED ORDER — OMEPRAZOLE 40 MG PO CPDR: 40.0000 mg | DELAYED_RELEASE_CAPSULE | Freq: Every day | ORAL | 3 refills | Status: AC

## 2020-07-29 NOTE — Progress Notes (Signed)
Subjective:    Patient ID: Angela Quinn, female    DOB: 1961/08/17, 59 y.o.   MRN: [id number]  Angela Quinn is a 59 y.o. female presenting on 07/29/2020 for Anxiety (Follow up --wants mammogram referral)   HPI  Left Breast Mass/Nodularity / Pain Reports new problem over past 3 months or so unsure exact timeline. Has not had mammogram since 2013, identified in chart in [facility] program through [place]. Negative at that time. She has no skin changes or redness. Unsure of exact spot today by her report.  Anxiety Currently fairly well controlled. No new concerns.  GERD Controlled on med, request refill  Pre-Diabetes Last done A1c in 2020 - at 6.2, due for repeat Not ready for labs today   Health Maintenance: Colon CA Screening: Never had colonoscopy or other screening test. Asymptomatic. Due for testing - agrees to cologuard  Breast CA Screening: Due for mammogram screening. Last mammogram result negative 2013 - see above  Depression screen [facility] 2/9 07/29/2020 05/22/2019 11/07/2018  Decreased Interest 0 0 0  Down, Depressed, Hopeless 0 0 0  PHQ - 2 Score 0 0 0  Altered sleeping 1 - 3  Tired, decreased energy 0 - 0  Change in appetite 2 - 0  Feeling bad or failure about yourself  0 - 0  Trouble concentrating 0 - 0  Moving slowly or fidgety/restless 0 - 0  Suicidal thoughts 0 - 0  PHQ-9 Score 3 - 3  Difficult doing work/chores Somewhat difficult - Not difficult at all   GAD 7 : Generalized Anxiety Score 07/29/2020 11/07/2018 05/09/2018 02/07/2018  Nervous, Anxious, on Edge 1 0 2 0  Control/stop worrying 1 1 2 1   Worry too much - different things 1 1 2 1   Trouble relaxing 1 1 2  0  Restless 1 1 1  0  Easily annoyed or irritable 1 2 3 1   Afraid - awful might happen 0 0 0 0  Total GAD 7 Score 6 6 12 3   Anxiety Difficulty Somewhat difficult Somewhat difficult Somewhat difficult Somewhat difficult     Social History   Tobacco Use  . Smoking status: Former  Smoker    Packs/day: 1.00    Years: 20.00    Pack years: 20.00    Types: Cigarettes    Quit date: 08/19/1999    Years since quitting: 20.9  . Smokeless tobacco: Never Used  Vaping Use  . Vaping Use: Never used  Substance Use Topics  . Alcohol use: No    Alcohol/week: 0.0 standard drinks  . Drug use: No    Review of Systems Per HPI unless specifically indicated above     Objective:    BP 135/81   Pulse 70   Temp 97.8 F (36.6 C) (Temporal)   Resp 16   Ht 5\' 4"  (1.626 m)   Wt 228 lb 9.6 oz (103.7 kg)   LMP 11/15/2016   SpO2 96%   BMI 39.24 kg/m   Wt Readings from Last 3 Encounters:  07/29/20 228 lb 9.6 oz (103.7 kg)  10/02/19 220 lb (99.8 kg)  08/19/19 225 lb (102.1 kg)    Physical Exam Vitals and nursing note reviewed.  Constitutional:      General: She is not in acute distress.    Appearance: She is well-developed and well-nourished. She is not diaphoretic.     Comments: Well-appearing, comfortable, cooperative  HENT:     Head: Normocephalic and atraumatic.     Mouth/Throat:  Mouth: Oropharynx is clear and moist.  Eyes:     General:        Right eye: No discharge.        Left eye: No discharge.     Conjunctiva/sclera: Conjunctivae normal.  Cardiovascular:     Rate and Rhythm: Normal rate.  Pulmonary:     Effort: Pulmonary effort is normal.  Chest:     Chest wall: No mass, lacerations, deformity, swelling or tenderness.  Breasts: Breasts are symmetrical.     Right: No swelling, bleeding, inverted nipple, mass, nipple discharge, skin change, tenderness, axillary adenopathy or supraclavicular adenopathy.     Left: No swelling, bleeding, inverted nipple, mass, nipple discharge, skin change, tenderness, axillary adenopathy or supraclavicular adenopathy.     Musculoskeletal:        General: No edema.  Lymphadenopathy:     Upper Body:     Right upper body: No supraclavicular, axillary or pectoral adenopathy.     Left upper body: No supraclavicular,  axillary or pectoral adenopathy.  Skin:    General: Skin is warm and dry.     Findings: No erythema or rash.  Neurological:     Mental Status: She is alert and oriented to person, place, and time.  Psychiatric:        Mood and Affect: Mood and affect normal.        Behavior: Behavior normal.     Comments: Well groomed, good eye contact, normal speech and thoughts      SCREENING MAMMO BCCCP HD   Result Transcription  01/18/2003 11:32 AM EDT  Patient: Angela Quinn, Angela Quinn  [id number]  ______________________________________________________________________________   Rad Rpt [facility]) Backload: Final  01/18/2003 11:32 Req# [id number]   Acct#   SCREENING MAMMO BCCCP HD      EXAMINATION:  1962 [facility] MAMMOGRAPHY HEALTH DEPT  Jan 18 2003 11:32AM  REASON: SCREENING  [id number]  RESULT: BILATERAL SCREENING MAMMOGRAMS:  CLINICAL HISTORY: Asymptomatic baseline  COMPARISON: None available  FINDINGS: No mammographic evidence of malignancy seen. ()  Amorphous fibroglandular tissue occupies greater than 50% volume of the  breast. This will decrease our accuracy in the early diagnosis of  cancer, since the glandular density may obscure early signs of malignancy.  IMPRESSION:  1. No mammographic signs of malignancy identified  2. Routine mammograms are recommended.  BIRADS: Category 1: Negative.  NOTE: Results of the exam were mailed to the patient.        (Letter type: Negative )  Investigation of a clinically suspicious lesion should not be precluded  by lack of x-ray findings. 10-15% of breast cancers may not be detected  on mammograms. Dense breast parenchyma may obscure breast cancer.  BUNDLE: <BUNDLE_DOC>  Read By: Kerney Elbe M.D.  Transcribed By: [facility]  Electronically Signed By: Kerney Elbe M.D. Specimen Collected: -- Last Resulted: --  Date: 07/29/12   Received From: [facility]      Results for orders placed or performed in visit on  06/16/19  Novel Coronavirus, NAA ([facility])   Specimen: Oropharyngeal(OP) collection in vial transport medium   OROPHARYNGEA  TESTING  Result Value Ref Range   SARS-CoV-2, NAA Not Detected Not Detected      Assessment & Plan:   Problem List Items Addressed This Visit    Prediabetes - Primary   Relevant Orders   Hemoglobin A1c   Obesity (BMI 30-39.9)   Relevant Orders   CBC with Differential/Platelet   COMPLETE METABOLIC PANEL WITH GFR   Lipid  panel   GERD (gastroesophageal reflux disease)   Relevant Medications   omeprazole (PRILOSEC) 40 MG capsule   Other Relevant Orders   CBC with Differential/Platelet   COMPLETE METABOLIC PANEL WITH GFR   Anxiety    Other Visit Diagnoses    Needs flu shot       Relevant Orders   Flu Vaccine QUAD 36+ mos IM (Completed)   Screening for colon cancer       Relevant Orders   Cologuard   Left breast mass       Relevant Orders   MM DIAG BREAST TOMO BILATERAL   US BREAST LTD UNI LEFT INC AXILLA   Breast pain, left       Relevant Orders   MM DIAG BREAST TOMO BILATERAL   US BREAST LTD UNI LEFT INC AXILLA      Cologuard ordered.  #L Breast pain/discomfort, possible mass Unable to identify precise mass patient described today, generalized area L outer breast, will order Diagnostic Mammogram bilateral and L breast US. Last mammo reviewed [facility] 2013 negative  #Obesity BMI PreDM Due for fasting labs A1c Lipid CMET and other will order for now return tomorrow for lab only Should return q 6 months for PreDM checks  #GERD Re order PPI therapy   Orders Placed This Encounter  Procedures  . MM DIAG BREAST TOMO BILATERAL    Standing Status:   Future    Standing Expiration Date:   01/27/2021    Order Specific Question:   Reason for Exam (SYMPTOM  OR DIAGNOSIS REQUIRED)    Answer:   3 month worsening left inner breast pain and some nodularity    Order Specific Question:   Preferred imaging location?    Answer:   [facility]  .  US BREAST LTD UNI LEFT INC AXILLA    Standing Status:   Future    Standing Expiration Date:   01/27/2021    Order Specific Question:   Reason for Exam (SYMPTOM  OR DIAGNOSIS REQUIRED)    Answer:   left breast nodularity and pain for 3 months, last mammo 2013 [facility] in [place] Specific Question:   Preferred imaging location?    Answer:   [facility]  . Flu Vaccine QUAD 36+ mos IM  . Cologuard     Meds ordered this encounter  Medications  . omeprazole (PRILOSEC) 40 MG capsule    Sig: Take 1 capsule (40 mg total) by mouth daily.    Dispense:  90 capsule    Refill:  3      Follow up plan: Return in about 1 day (around 07/30/2020) for 1 day fasting lab only then follow-up 6 month Follow-up PreDM A1c.  Future labs ordered for tomorrow 12/14   Saralyn Pilar, DO [facility] 07/29/2020, 11:31 AM

## 2020-07-29 NOTE — Patient Instructions (Addendum)
Thank you for coming to the office today.  For Mammogram screening for breast cancer   Call the Imaging Center below anytime to schedule your own appointment now that order has been placed.  [facility] [address] [place], [place] [address] Phone: [phone]  Flu Shot today  Labs tomorrow   DUE for FASTING BLOOD WORK (no food or drink after midnight before the lab appointment, only water or coffee without cream/sugar on the morning of)  SCHEDULE "Lab Only" visit in the morning at the clinic for lab draw in MORNING  - Make sure Lab Only appointment is at about 1 week before your next appointment, so that results will be available  For Lab Results, once available within 2-3 days of blood draw, you can can log in to MyChart online to view your results and a brief explanation. Also, we can discuss results at next follow-up visit.    Please schedule a Follow-up Appointment to: Return in about 1 day (around 07/30/2020) for 1 day fasting lab only then follow-up 6 month Follow-up PreDM A1c.  If you have any other questions or concerns, please feel free to call the office or send a message through MyChart. You may also schedule an earlier appointment if necessary.  Additionally, you may be receiving a survey about your experience at our office within a few days to 1 week by e-mail or mail. We value your feedback.  Saralyn Pilar, DO [facility], [place]

## 2020-07-30 ENCOUNTER — Other Ambulatory Visit: Payer: Self-pay

## 2020-07-31 LAB — CBC WITH DIFFERENTIAL/PLATELET
Absolute Monocytes: 486 cells/uL (ref 200–950)
Basophils Absolute: 30 cells/uL (ref 0–200)
Basophils Relative: 0.5 %
Eosinophils Absolute: 102 cells/uL (ref 15–500)
Eosinophils Relative: 1.7 %
HCT: 42.7 % (ref 35.0–45.0)
Hemoglobin: 14.3 g/dL (ref 11.7–15.5)
Lymphs Abs: 1692 cells/uL (ref 850–3900)
MCH: 28.7 pg (ref 27.0–33.0)
MCHC: 33.5 g/dL (ref 32.0–36.0)
MCV: 85.6 fL (ref 80.0–100.0)
MPV: 11 fL (ref 7.5–12.5)
Monocytes Relative: 8.1 %
Neutro Abs: 3690 cells/uL (ref 1500–7800)
Neutrophils Relative %: 61.5 %
Platelets: 258 10*3/uL (ref 140–400)
RBC: 4.99 10*6/uL (ref 3.80–5.10)
RDW: 12.8 % (ref 11.0–15.0)
Total Lymphocyte: 28.2 %
WBC: 6 10*3/uL (ref 3.8–10.8)

## 2020-07-31 LAB — COMPLETE METABOLIC PANEL WITH GFR
AG Ratio: 1.6 (calc) (ref 1.0–2.5)
ALT: 17 U/L (ref 6–29)
AST: 17 U/L (ref 10–35)
Albumin: 4.1 g/dL (ref 3.6–5.1)
Alkaline phosphatase (APISO): 103 U/L (ref 37–153)
BUN: 15 mg/dL (ref 7–25)
CO2: 24 mmol/L (ref 20–32)
Calcium: 9.1 mg/dL (ref 8.6–10.4)
Chloride: 109 mmol/L (ref 98–110)
Creat: 0.72 mg/dL (ref 0.50–1.05)
GFR, Est African American: 106 mL/min/{1.73_m2} (ref >=60)
GFR, Est Non African American: 92 mL/min/{1.73_m2} (ref >=60)
Globulin: 2.5 g/dL (calc) (ref 1.9–3.7)
Glucose, Bld: 100 mg/dL — ABNORMAL HIGH (ref 65–99)
Potassium: 4.3 mmol/L (ref 3.5–5.3)
Sodium: 142 mmol/L (ref 135–146)
Total Bilirubin: 0.4 mg/dL (ref 0.2–1.2)
Total Protein: 6.6 g/dL (ref 6.1–8.1)

## 2020-07-31 LAB — LIPID PANEL
Cholesterol: 172 mg/dL (ref <200)
HDL: 33 mg/dL — ABNORMAL LOW (ref >=50)
LDL Cholesterol (Calc): 114 mg/dL (calc) — ABNORMAL HIGH
Non-HDL Cholesterol (Calc): 139 mg/dL (calc) — ABNORMAL HIGH (ref <130)
Total CHOL/HDL Ratio: 5.2 (calc) — ABNORMAL HIGH (ref <5.0)
Triglycerides: 142 mg/dL (ref <150)

## 2020-07-31 LAB — HEMOGLOBIN A1C
Hgb A1c MFr Bld: 6.7 % of total Hgb — ABNORMAL HIGH (ref <5.7)
Mean Plasma Glucose: 146 mg/dL
eAG (mmol/L): 8.1 mmol/L

## 2020-08-06 LAB — COLOGUARD: Cologuard: NEGATIVE

## 2020-08-08 ENCOUNTER — Telehealth: Payer: Self-pay

## 2020-08-08 NOTE — Telephone Encounter (Signed)
Will await Dr. Kirtland Bouchard return 12/27

## 2020-08-08 NOTE — Telephone Encounter (Signed)
Copied from CRM [id number]. Topic: General - Other >> Aug 08, 2020 10:50 AM Marylen Ponto wrote: Reason for CRM: Pt stated she is not taking the metformin and she would like to know if another medication will be prescribed. Pt requests call back

## 2020-08-08 NOTE — Telephone Encounter (Signed)
I called the patient and informed her that Dr.K is out of the office. She said she been off the metformin for awhile and that she made  Dr. Kirtland Bouchard  aware of this at her visit last week. She would like to start on an alternative medication . I informed her that I will send this request to Dr. Kirtland Bouchard and someone will f/u with her on next week. She verbalize understanding.

## 2020-08-09 LAB — COLOGUARD: Cologuard: NEGATIVE

## 2020-08-12 NOTE — Telephone Encounter (Signed)
Attempted to call patient during lunch hour, around 1250pm today Mon 12/27. Did not reach her.  Her A1c was up to 6.7 last time. She has not been diagnosed with Type 2 Diabetes at this time, still classified as Pre-Diabetes for now.  There is no other medication other than metformin approved for Pre-Diabetes.  Technically at this range of blood sugar, the recommendation is mostly to improve lifestyle, to PREVENT Diabetes.  If a Diabetic medication is prescribed, then she would be considered a Type 2 Diabetic.  Goal is to limit sugar, starch, carbs and improve exercise before her next apt in 6 months to re-check this to determine if A1c is improved or not.  Saralyn Pilar, DO [facility] 08/12/2020, 12:53 PM

## 2020-08-12 NOTE — Telephone Encounter (Signed)
Patient informed. 

## 2020-08-13 ENCOUNTER — Encounter: Payer: Self-pay | Admitting: Family Medicine

## 2020-08-19 ENCOUNTER — Ambulatory Visit
Admission: RE | Admit: 2020-08-19 | Discharge: 2020-08-19 | Disposition: A | Discharge status: Home / Self Care | Payer: 59 | Source: Ambulatory Visit | Attending: Family Medicine | Admitting: Family Medicine

## 2020-08-19 ENCOUNTER — Other Ambulatory Visit: Payer: Self-pay

## 2020-08-19 DIAGNOSIS — N6320 Unspecified lump in the left breast, unspecified quadrant: Secondary | ICD-10-CM

## 2020-08-19 DIAGNOSIS — N644 Mastodynia: Secondary | ICD-10-CM | POA: Insufficient documentation

## 2020-08-27 ENCOUNTER — Other Ambulatory Visit: Payer: Self-pay | Admitting: Family Medicine

## 2020-08-27 DIAGNOSIS — R7303 Prediabetes: Secondary | ICD-10-CM

## 2020-10-14 ENCOUNTER — Ambulatory Visit
Admission: EM | Admit: 2020-10-14 | Discharge: 2020-10-14 | Disposition: A | Discharge status: Home / Self Care | Payer: 59 | Attending: Sports Medicine | Admitting: Sports Medicine

## 2020-10-14 ENCOUNTER — Encounter: Payer: Self-pay | Admitting: Emergency Medicine

## 2020-10-14 ENCOUNTER — Other Ambulatory Visit: Payer: Self-pay

## 2020-10-14 DIAGNOSIS — R21 Rash and other nonspecific skin eruption: Secondary | ICD-10-CM | POA: Diagnosis not present

## 2020-10-14 DIAGNOSIS — B369 Superficial mycosis, unspecified: Secondary | ICD-10-CM

## 2020-10-14 DIAGNOSIS — L239 Allergic contact dermatitis, unspecified cause: Secondary | ICD-10-CM

## 2020-10-14 DIAGNOSIS — L299 Pruritus, unspecified: Secondary | ICD-10-CM

## 2020-10-14 MED ORDER — NYSTATIN-TRIAMCINOLONE 100000-0.1 UNIT/GM-% EX CREA: 1.0000 "application " | TOPICAL_CREAM | Freq: Two times a day (BID) | CUTANEOUS | 0 refills | Status: DC

## 2020-10-14 NOTE — Discharge Instructions (Signed)
I prescribed a cream for your rash.  This will cover an allergic or contact dermatitis cause as well as a potential fungal skin infection cause. Provided educational handout. Please keep the area clean and dry. If you develop worsening rash or any fever you could be developing a superimposed bacterial infection and you should be evaluated by a healthcare provider. You can use topical Benadryl or oral Benadryl for the itch.  I hope you get the feeling better, Dr. Zachery Dauer

## 2020-10-14 NOTE — ED Triage Notes (Signed)
Patient c/o rash that started 2 days ago on her upper legs. Patient states she thinks she came in contact with poison ivy but is unsure.

## 2020-10-16 NOTE — ED Provider Notes (Signed)
[facility]    CSN: [id number] Arrival date & time: 10/14/20  1213      History   Chief Complaint Chief Complaint  Patient presents with  . Rash    HPI Angela Quinn is a 60 y.o. female.   Pleasant 60-year-old female who presents for evaluation of a rash that began 2 to 3 days ago bilateral inner upper leg.  She does not recall any significant contact with anything that caused this rash.  She thinks she may have come in contact with poison but she does not recall.  No fever shakes chills.  She works as a long-haul truck driver.  No fever shakes chills.  No nausea vomiting diarrhea.  No abdominal or urinary symptoms.  No chest pain or shortness of breath.  No red flag signs or symptoms elicited on history.     Past Medical History:  Diagnosis Date  . GERD (gastroesophageal reflux disease)   . Urine incontinence     Patient Active Problem List   Diagnosis Date Noted  . Obesity (BMI 30-39.9) 07/29/2020  . Elevated BP without diagnosis of hypertension 11/10/2018  . Anxiety 05/09/2018  . Prediabetes 01/03/2018  . Urinary, incontinence, stress female 11/27/2016  . Vitamin D insufficiency 07/29/2016  . Abnormal MRI, cervical spine 04/01/2016  . Urine incontinence 02/07/2016  . GERD (gastroesophageal reflux disease) 02/07/2016  . Post-menopausal bleeding 02/07/2016    Past Surgical History:  Procedure Laterality Date  . EYE SURGERY    . TUBAL LIGATION      OB History    Gravida  4   Para  4   Term  4   Preterm      AB      Living  4     SAB      IAB      Ectopic      Multiple      Live Births               Home Medications    Prior to Admission medications   Medication Sig Start Date End Date Taking? Authorizing Provider  nystatin-triamcinolone (MYCOLOG II) cream Apply 1 application topically 2 (two) times daily. 10/14/20  Yes Barnes, Kenneth, MD  blood glucose meter kit and supplies Dispense based on patient and insurance  preference. Use once daily as directed. Patient not taking: No sig reported 05/09/18   Kennedy, Lauren Renee, NP  glucose blood test strip  05/17/18   [provider]  Microlet Lancets MISC  05/09/18   [provider]  omeprazole (PRILOSEC) 40 MG capsule Take 1 capsule (40 mg total) by mouth daily. 07/29/20   Karamalegos, Alexander J, DO    Family History Family History  Problem Relation Age of Onset  . Hypertension Mother   . Diabetes Mother   . Arthritis Mother   . Cancer Father        brain tumor    Social History Social History   Tobacco Use  . Smoking status: Former Smoker    Packs/day: 1.00    Years: 20.00    Pack years: 20.00    Types: Cigarettes    Quit date: 08/19/1999    Years since quitting: 21.1  . Smokeless tobacco: Never Used  Vaping Use  . Vaping Use: Never used  Substance Use Topics  . Alcohol use: No    Alcohol/week: 0.0 standard drinks  . Drug use: No     Allergies   Penicillins   Review of   Systems Review of Systems  Constitutional: Negative for appetite change, chills, fatigue and fever.  HENT: Negative.   Eyes: Negative.   Respiratory: Negative.   Cardiovascular: Negative.   Gastrointestinal: Negative.   Genitourinary: Negative.   Musculoskeletal: Negative.   Skin: Positive for rash. Negative for color change, pallor and wound.  Neurological: Negative.   All other systems reviewed and are negative.    Physical Exam Triage Vital Signs ED Triage Vitals  Enc Vitals Group     BP 10/14/20 1240 (!) 151/88     Pulse Rate 10/14/20 1240 76     Resp 10/14/20 1240 18     Temp 10/14/20 1240 98 F (36.7 C)     Temp Source 10/14/20 1240 Oral     SpO2 10/14/20 1240 99 %     Weight 10/14/20 1239 228 lb 9.9 oz (103.7 kg)     Height 10/14/20 1239 5' 4" (1.626 m)     Head Circumference --      Peak Flow --      Pain Score 10/14/20 1239 0     Pain Loc --      Pain Edu? --      Excl. in [place]? --    No data found.  Updated Vital  Signs BP (!) 151/88 (BP Location: Right Arm)   Pulse 76   Temp 98 F (36.7 C) (Oral)   Resp 18   Ht 5' 4" (1.626 m)   Wt 103.7 kg   LMP 11/15/2016   SpO2 99%   BMI 39.24 kg/m   Visual Acuity Right Eye Distance:   Left Eye Distance:   Bilateral Distance:    Right Eye Near:   Left Eye Near:    Bilateral Near:     Physical Exam Vitals and nursing note reviewed.  Constitutional:      General: She is not in acute distress.    Appearance: Normal appearance. She is normal weight. She is not ill-appearing or toxic-appearing.  HENT:     Head: Normocephalic and atraumatic.  Cardiovascular:     Rate and Rhythm: Normal rate and regular rhythm.     Pulses: Normal pulses.     Heart sounds: Normal heart sounds. No murmur heard. No friction rub. No gallop.   Pulmonary:     Effort: Pulmonary effort is normal. No respiratory distress.     Breath sounds: Normal breath sounds. No stridor. No wheezing, rhonchi or rales.  Musculoskeletal:     Cervical back: Normal range of motion and neck supple.  Skin:    General: Skin is warm.     Comments: Patient has a rash bilateral inner thighs consistent with contact dermatitis.  It is not draining or weeping.  There are few satellite lesions can concerning for Candida infection.  No active infection.  Neurological:     Mental Status: She is alert.      UC Treatments / Results  Labs (all labs ordered are listed, but only abnormal results are displayed) Labs Reviewed - No data to display  EKG   Radiology No results found.  Procedures Procedures (including critical care time)  Medications Ordered in UC Medications - No data to display  Initial Impression / Assessment and Plan / UC Course  I have reviewed the triage vital signs and the nursing notes.  Pertinent labs & imaging results that were available during my care of the patient were reviewed by me and considered in my medical decision making (see chart for details).  Clinical  impression: Bilateral inner upper leg rash for 2 to 3 days.  Consistent with contact dermatitis.  May well have a superimposed Candida infection as well.  Treatment plan: 1.  The findings and treatment plan were discussed in detail with the patient.  Patient was in agreement. 2.  Martin Majestic ahead and prescribed Mycolog to cover the contact dermatitis as well as a potential candidal infection. 3.  She can use oral Benadryl and Benadryl cream for the itch. 4.  I did not give her oral steroids given the fact that she is a type II diabetic and her blood sugars are not well controlled. 5.  Educational handout provided. 6.  Over-the-counter meds as needed. 7.  Keep the area clean and dry and covered as needed. 8.  If it becomes red, weeps, or there is concern for infection she should seek out medical attention.  If that happens she may develop a super imposed bacterial infection. 9.  Follow-up here as needed.    Final Clinical Impressions(s) / UC Diagnoses   Final diagnoses:  Rash  Allergic contact dermatitis, unspecified trigger  Superficial fungal infection of skin  Pruritus     Discharge Instructions     I prescribed a cream for your rash.  This will cover an allergic or contact dermatitis cause as well as a potential fungal skin infection cause. Provided educational handout. Please keep the area clean and dry. If you develop worsening rash or any fever you could be developing a superimposed bacterial infection and you should be evaluated by a healthcare provider. You can use topical Benadryl or oral Benadryl for the itch.  I hope you get the feeling better, Dr. Drema [place]    ED Prescriptions    Medication Sig Dispense Auth. Provider   nystatin-triamcinolone (MYCOLOG II) cream Apply 1 application topically 2 (two) times daily. 30 g Verda Cumins, MD     PDMP not reviewed this encounter.   Verda Cumins, MD 10/17/20 [id number]

## 2021-01-27 ENCOUNTER — Ambulatory Visit: Payer: Self-pay | Admitting: Family Medicine

## 2021-02-25 IMAGING — MG DIGITAL DIAGNOSTIC BILAT W/ TOMO W/ CAD
6 of 10 series · 6 of 30 positions shown · non-contrast
Comparison: None.

CLINICAL DATA: 59-year-old female with focal left breast pain for 3
months.

EXAM:
DIGITAL DIAGNOSTIC BILATERAL MAMMOGRAM WITH CAD AND TOMO
ULTRASOUND LEFT BREAST

[R MLO synth-2D]
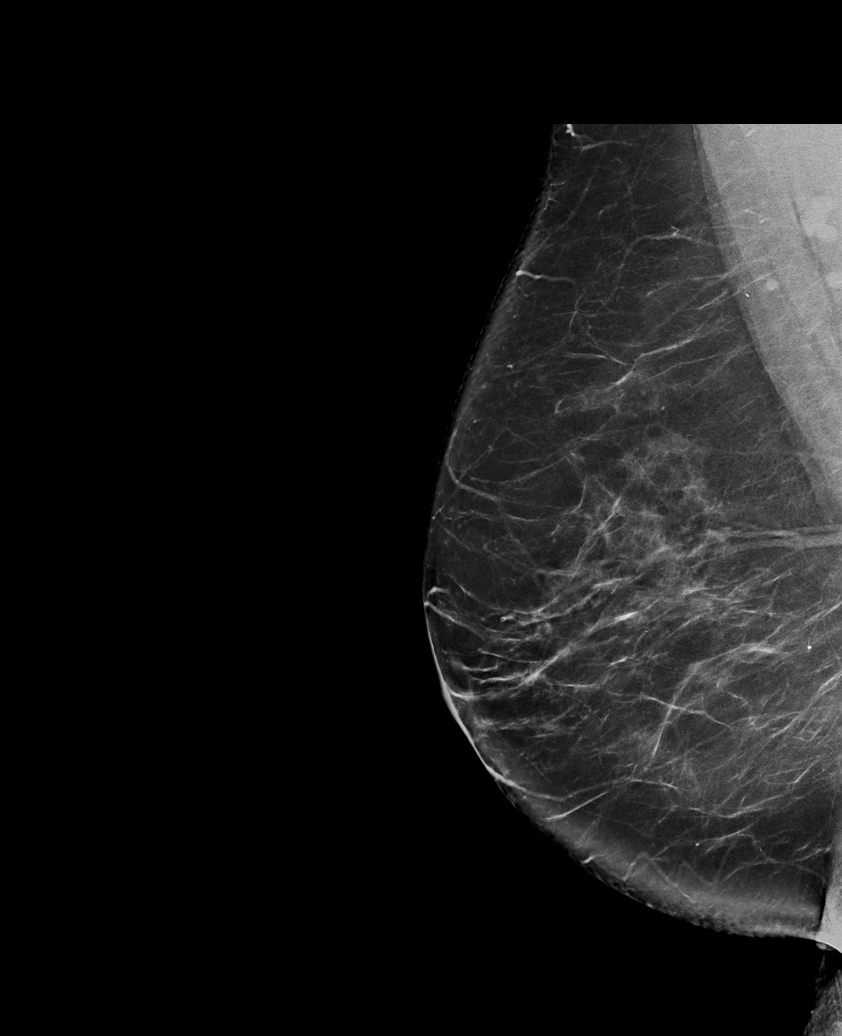

[L TAN synth-2D]
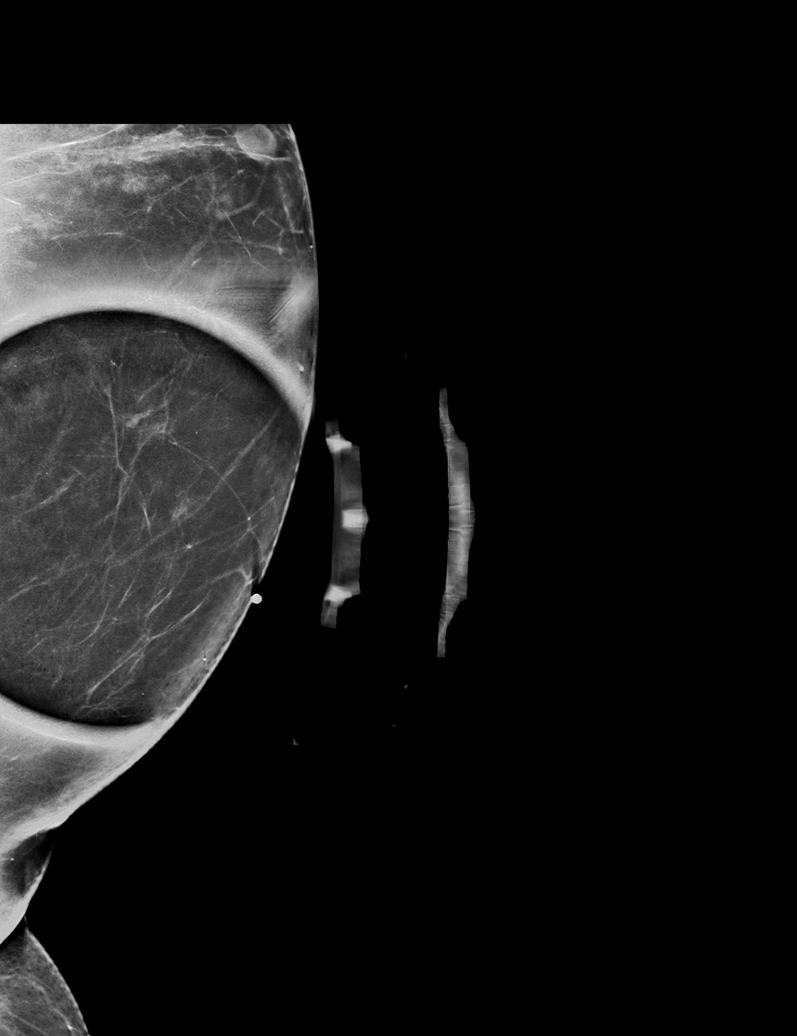

[L MLO synth-2D]
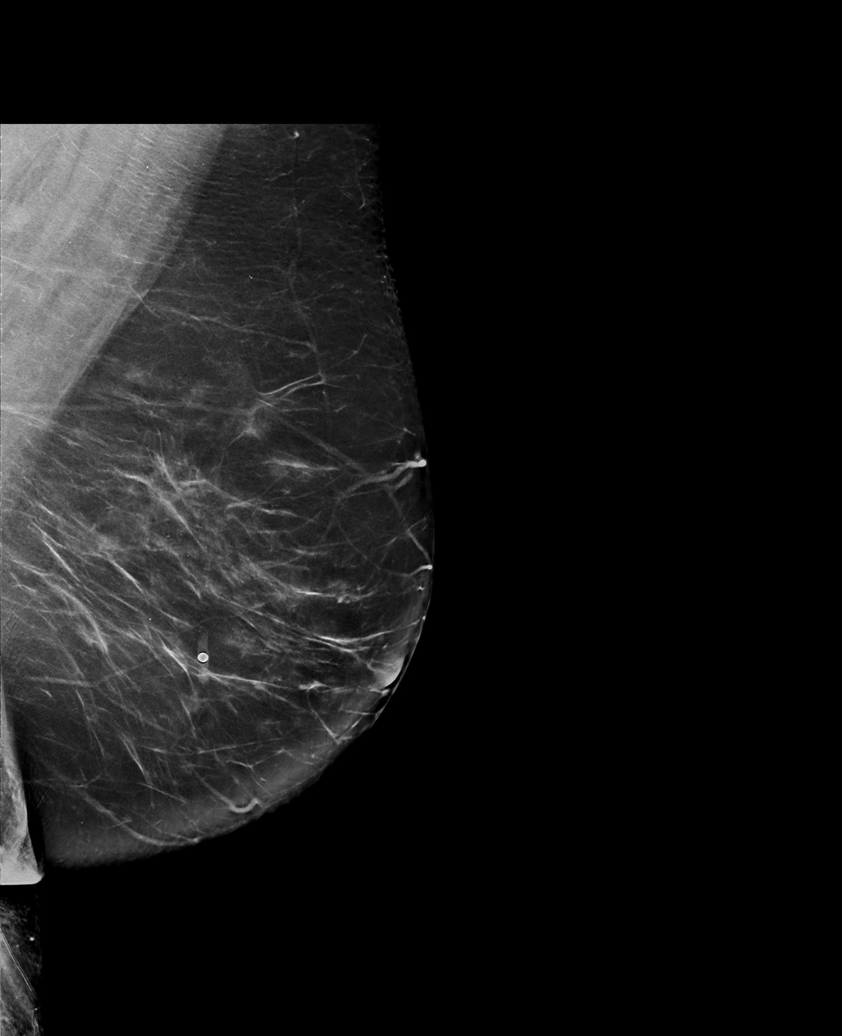

[R CC synth-2D]
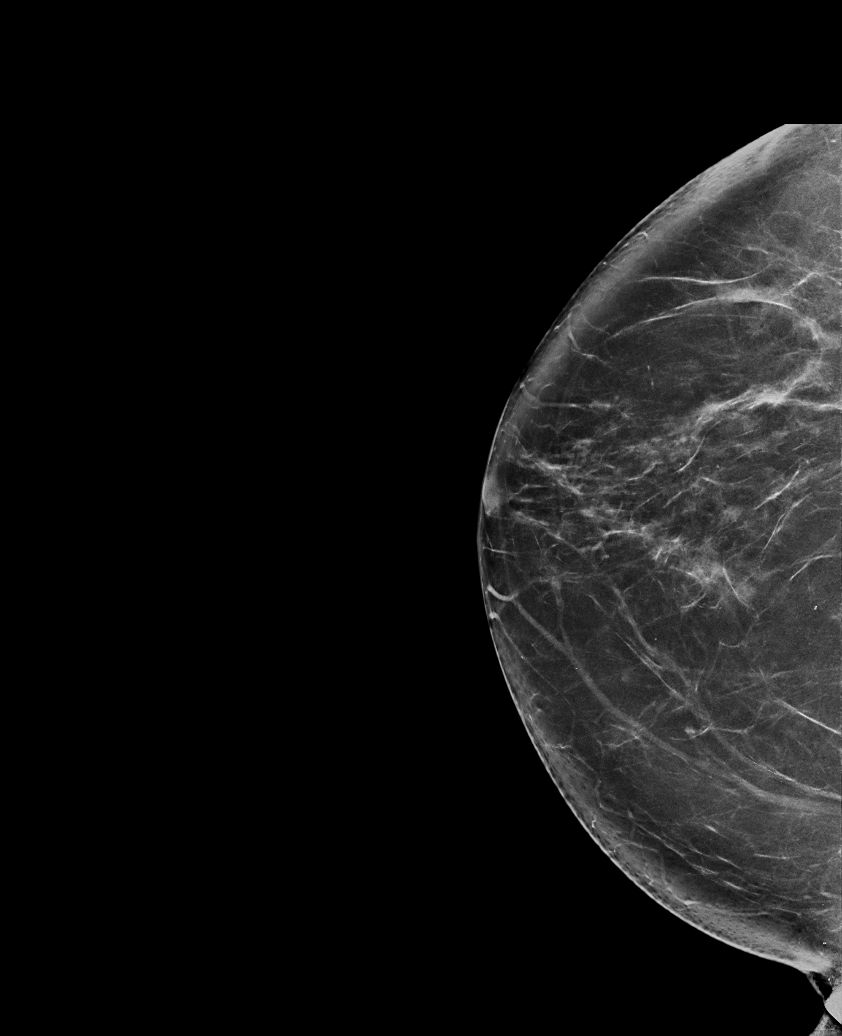

[L CC synth-2D]
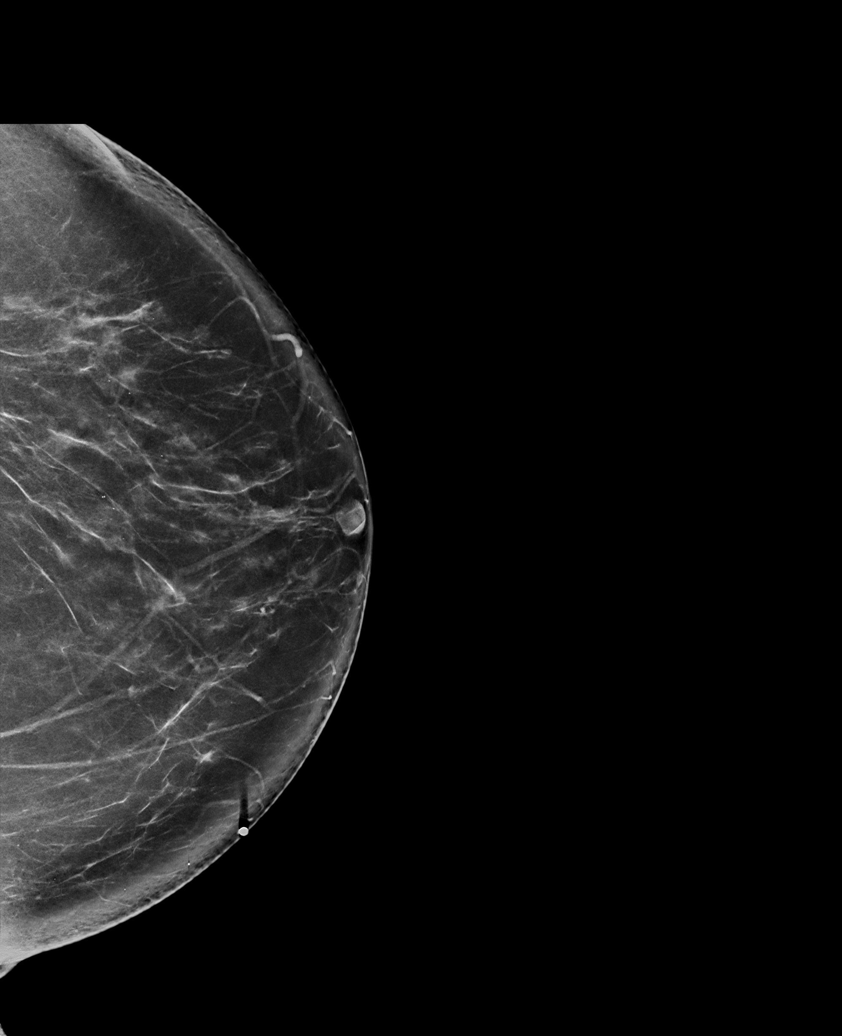

[L MLO tomo · tomo slice 49/97.0]
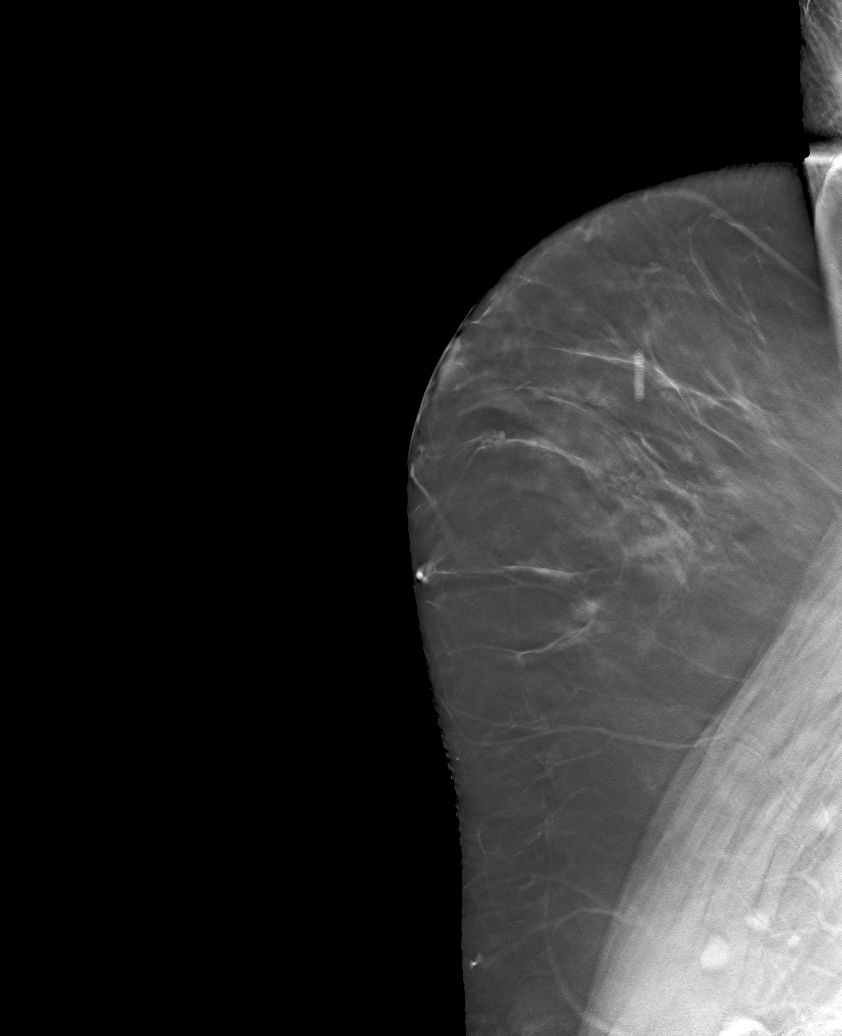

[6 of 30 positions shown; findings below may reference images not displayed]

ACR Breast Density Category b: There are scattered areas of
fibroglandular density.
FINDINGS: A radiopaque BB was placed at the site of the patient's focal pain
in the inner left breast. No focal or suspicious mammographic
findings are seen deep to the radiopaque BB or within the remainder
of either breast. Punctate calcifications diffusely scattered
bilaterally are consistent with a benign etiology.

Mammographic images were processed with CAD.

Targeted ultrasound is performed, showing normal fibroglandular
tissue without focal or suspicious sonographic abnormality in the
medial left breast.
IMPRESSION: 1. No mammographic evidence of malignancy in either breast.
2. No suspicious sonographic findings at the site of the patient's
focal left breast pain.

RECOMMENDATION:
1. Clinical follow-up recommended for the painful area of concern in
the left breast. Any further workup should be based on clinical
grounds.
2.  Screening mammogram in one year.(Code:HL-3-L06)

I have discussed the findings and recommendations with the patient.
If applicable, a reminder letter will be sent to the patient
regarding the next appointment.

BI-RADS CATEGORY  2: Benign.

## 2021-08-13 ENCOUNTER — Ambulatory Visit: Payer: Self-pay | Admitting: Internal Medicine

## 2021-08-13 ENCOUNTER — Other Ambulatory Visit: Payer: Self-pay

## 2021-08-13 ENCOUNTER — Encounter: Payer: Self-pay | Admitting: Internal Medicine

## 2021-08-13 VITALS — BP 147/70 | HR 61 | Temp 97.3°F | Resp 18 | Ht 64.0 in | Wt 230.8 lb

## 2021-08-13 DIAGNOSIS — J3089 Other allergic rhinitis: Secondary | ICD-10-CM

## 2021-08-13 DIAGNOSIS — Z23 Encounter for immunization: Secondary | ICD-10-CM

## 2021-08-13 DIAGNOSIS — R1013 Epigastric pain: Secondary | ICD-10-CM

## 2021-08-13 DIAGNOSIS — R1011 Right upper quadrant pain: Secondary | ICD-10-CM

## 2021-08-13 MED ORDER — NYSTATIN-TRIAMCINOLONE 100000-0.1 UNIT/GM-% EX CREA: 1.0000 "application " | TOPICAL_CREAM | Freq: Two times a day (BID) | CUTANEOUS | 0 refills | Status: AC

## 2021-08-13 NOTE — Patient Instructions (Signed)
Abdominal Pain, Adult Many things can cause belly (abdominal) pain. Most times, belly pain is not dangerous. Many cases of belly pain can be watched and treated at home. Sometimes, though, belly pain is serious. Yourdoctor will try to find the cause of your belly pain. Follow these instructions at home:  Medicines Take over-the-counter and prescription medicines only as told by your doctor. Do not take medicines that help you poop (laxatives) unless told by your doctor. General instructions Watch your belly pain for any changes. Drink enough fluid to keep your pee (urine) pale yellow. Keep all follow-up visits as told by your doctor. This is important. Contact a doctor if: Your belly pain changes or gets worse. You are not hungry, or you lose weight without trying. You are having trouble pooping (constipated) or have watery poop (diarrhea) for more than 2-3 days. You have pain when you pee or poop. Your belly pain wakes you up at night. Your pain gets worse with meals, after eating, or with certain foods. You are vomiting and cannot keep anything down. You have a fever. You have blood in your pee. Get help right away if: Your pain does not go away as soon as your doctor says it should. You cannot stop vomiting. Your pain is only in areas of your belly, such as the right side or the left lower part of the belly. You have bloody or black poop, or poop that looks like tar. You have very bad pain, cramping, or bloating in your belly. You have signs of not having enough fluid or water in your body (dehydration), such as: Dark pee, very little pee, or no pee. Cracked lips. Dry mouth. Sunken eyes. Sleepiness. Weakness. You have trouble breathing or chest pain. Summary Many cases of belly pain can be watched and treated at home. Watch your belly pain for any changes. Take over-the-counter and prescription medicines only as told by your doctor. Contact a doctor if your belly pain  changes or gets worse. Get help right away if you have very bad pain, cramping, or bloating in your belly. This information is not intended to replace advice given to you by your health care provider. Make sure you discuss any questions you have with your healthcare provider. Document Revised: 12/12/2018 Document Reviewed: 12/12/2018 Elsevier Patient Education  2022 Elsevier Inc.  

## 2021-08-13 NOTE — Progress Notes (Signed)
HPI  Pt presents to the clinic today with c/o humming and bloody discharge from the right ears. She reports this started 4 weeks. She reports associated headache and cough. The cough is productive of yellow mucous. She denies runny nose, nasal congestion, sore throat, shortness of breath, chest pain, nausea, vomiting or diarrhea. She denies fever, chills or body aches. She has tried Dayquil and Nyquil OTC with minimal relief of symptoms. She has not had sick contacts diagnosed with flu or covid. She has not had her covid or flu vaccine.  She also reports intermittent epigastric pain, RUQ abdominal pain. This started 3 years ago, but has been intermittent. The pain is worse with increased activity or eating too much. She denies nausea, vomiting, bloating, constipation, diarrhea or blood in her stool. She takes Omeprazole as needed. She reports she had a colonoscopy last year.  Review of Systems      Past Medical History:  Diagnosis Date   GERD (gastroesophageal reflux disease)    Urine incontinence     Family History  Problem Relation Age of Onset   Hypertension Mother    Diabetes Mother    Arthritis Mother    Cancer Father        brain tumor    Social History   Socioeconomic History   Marital status: Divorced    Spouse name: Not on file   Number of children: Not on file   Years of education: Not on file   Highest education level: Not on file  Occupational History   Not on file  Tobacco Use   Smoking status: Former    Packs/day: 1.00    Years: 20.00    Pack years: 20.00    Types: Cigarettes    Quit date: 08/19/1999    Years since quitting: 22.0   Smokeless tobacco: Never  Vaping Use   Vaping Use: Never used  Substance and Sexual Activity   Alcohol use: No    Alcohol/week: 0.0 standard drinks   Drug use: No   Sexual activity: Not on file  Other Topics Concern   Not on file  Social History Narrative   Not on file   Social Determinants of Health   Financial  Resource Strain: Not on file  Food Insecurity: Not on file  Transportation Needs: Not on file  Physical Activity: Not on file  Stress: Not on file  Social Connections: Not on file  Intimate Partner Violence: Not on file    Allergies  Allergen Reactions   Penicillins      Constitutional: Positive headache. Denies fatigue, fever or abrupt weight changes.  HEENT:  Positive ear pain. Denies eye redness, eye pain, pressure behind the eyes, facial pain, nasal congestion, ringing in the ears, wax buildup, runny nose or sore throat. Respiratory: Positive cough. Denies difficulty breathing or shortness of breath.  Cardiovascular: Denies chest pain, chest tightness, palpitations or swelling in the hands or feet.  GI: Positive RUQ/epigastric pain. Denies nausea, vomiting, diarrhea, constipation or blood in her stool.  No other specific complaints in a complete review of systems (except as listed in HPI above).  Objective:   BP (!) 147/70 (BP Location: Left Arm, Patient Position: Sitting, Cuff Size: Large)    Pulse 61    Temp (!) 97.3 F (36.3 C) (Temporal)    Resp 18    Ht 5\' 4"  (1.626 m)    Wt 230 lb 12.8 oz (104.7 kg)    LMP 11/15/2016    SpO2 96%  BMI 39.62 kg/m   Wt Readings from Last 3 Encounters:  10/14/20 228 lb 9.9 oz (103.7 kg)  07/29/20 228 lb 9.6 oz (103.7 kg)  10/02/19 220 lb (99.8 kg)     General: Appears her stated age, obese,in NAD. HEENT: Head: normal shape and size; Eyes: sclera white, no icterus, conjunctiva pink; Ears: Tm's gray and intact, normal light reflex, + effusion on the right; Nose: mucosa pink and moist, septum midline; Throat/Mouth: + PND. Teeth present, mucosa erythematous and moist, no exudate noted, no lesions or ulcerations noted.  Neck: No cervical lymphadenopathy.  Cardiovascular: Normal rate and rhythm. S1,S2 noted.  No murmur, rubs or gallops noted.  Pulmonary/Chest: Normal effort and positive vesicular breath sounds. No respiratory distress. No  wheezes, rales or ronchi noted.  Abdomen: Normal bowel sounds. Mild tenderness noted with palpation in the RUQ. Negative Murphy's sign or rebound tenderness.      Assessment & Plan:   Allergic Rhinitis:  Start Zyrtec and Flonase OTC daily x 2 weeks  RUQ/Epigastric Pain:  Will check CMET, amylase and lipase Start Omeprazole daily Consider RUQ ultrasound pending labs  Will follow up after labs with further recommendation and treatment plan  RTC as needed or if symptoms persist.   Nicki Reaper, NP This visit occurred during the SARS-CoV-2 public health emergency.  Safety protocols were in place, including screening questions prior to the visit, additional usage of staff PPE, and extensive cleaning of exam room while observing appropriate contact time as indicated for disinfecting solutions.

## 2021-08-14 LAB — COMPLETE METABOLIC PANEL WITH GFR
AG Ratio: 1.7 (calc) (ref 1.0–2.5)
ALT: 15 U/L (ref 6–29)
AST: 19 U/L (ref 10–35)
Albumin: 4.3 g/dL (ref 3.6–5.1)
Alkaline phosphatase (APISO): 99 U/L (ref 37–153)
BUN: 18 mg/dL (ref 7–25)
CO2: 24 mmol/L (ref 20–32)
Calcium: 9.4 mg/dL (ref 8.6–10.4)
Chloride: 106 mmol/L (ref 98–110)
Creat: 0.91 mg/dL (ref 0.50–1.05)
Globulin: 2.5 g/dL (calc) (ref 1.9–3.7)
Glucose, Bld: 91 mg/dL (ref 65–139)
Potassium: 4.1 mmol/L (ref 3.5–5.3)
Sodium: 141 mmol/L (ref 135–146)
Total Bilirubin: 0.5 mg/dL (ref 0.2–1.2)
Total Protein: 6.8 g/dL (ref 6.1–8.1)
eGFR: 72 mL/min/{1.73_m2} (ref >=60)

## 2021-08-14 LAB — AMYLASE: Amylase: 28 U/L (ref 21–101)

## 2021-08-14 LAB — LIPASE: Lipase: 51 U/L (ref 7–60)

## 2022-10-31 ENCOUNTER — Emergency Department: Payer: Self-pay

## 2022-10-31 ENCOUNTER — Encounter: Payer: Self-pay | Admitting: Intensive Care

## 2022-10-31 ENCOUNTER — Emergency Department
Admission: EM | Admit: 2022-10-31 | Discharge: 2022-10-31 | Disposition: A | Discharge status: Home / Self Care | Payer: Self-pay | Attending: Emergency Medicine | Admitting: Emergency Medicine

## 2022-10-31 ENCOUNTER — Other Ambulatory Visit: Payer: Self-pay

## 2022-10-31 DIAGNOSIS — M8450XA Pathological fracture in neoplastic disease, unspecified site, initial encounter for fracture: Secondary | ICD-10-CM

## 2022-10-31 DIAGNOSIS — I10 Essential (primary) hypertension: Secondary | ICD-10-CM | POA: Insufficient documentation

## 2022-10-31 DIAGNOSIS — M84522A Pathological fracture in neoplastic disease, left humerus, initial encounter for fracture: Secondary | ICD-10-CM | POA: Insufficient documentation

## 2022-10-31 DIAGNOSIS — E119 Type 2 diabetes mellitus without complications: Secondary | ICD-10-CM | POA: Insufficient documentation

## 2022-10-31 DIAGNOSIS — C50919 Malignant neoplasm of unspecified site of unspecified female breast: Secondary | ICD-10-CM | POA: Insufficient documentation

## 2022-10-31 DIAGNOSIS — S42302A Unspecified fracture of shaft of humerus, left arm, initial encounter for closed fracture: Secondary | ICD-10-CM

## 2022-10-31 HISTORY — DX: Malignant neoplasm of unspecified site of unspecified female breast: C50.919

## 2022-10-31 HISTORY — DX: Essential (primary) hypertension: I10

## 2022-10-31 MED ORDER — OXYCODONE-ACETAMINOPHEN 5-325 MG PO TABS
1.0000 | ORAL_TABLET | Freq: Once | ORAL | Status: AC
  Administered 2022-10-31: 1 via ORAL
  Filled 2022-10-31: qty 1

## 2022-10-31 MED ORDER — ONDANSETRON 4 MG PO TBDP
4.0000 mg | ORAL_TABLET | Freq: Once | ORAL | Status: AC
  Administered 2022-10-31: 4 mg via ORAL
  Filled 2022-10-31: qty 1

## 2022-10-31 MED ORDER — FENTANYL CITRATE PF 50 MCG/ML IJ SOSY
50.0000 ug | PREFILLED_SYRINGE | Freq: Once | INTRAMUSCULAR | Status: AC
  Administered 2022-10-31: 50 ug via INTRAMUSCULAR
  Filled 2022-10-31: qty 1

## 2022-10-31 NOTE — ED Provider Notes (Signed)
[facility] Provider Note    Event Date/Time   First MD Initiated Contact with Patient 10/31/22 1738     (approximate)   History   Arm Pain (left)   HPI  Angela Quinn is a 62 y.o. female with history of diabetes, hypertension, GERD and recent breast cancer diagnosis presents emergency department complaining of left arm pain.  Patient states she is unsure if the door hit her arm but she reached up and felt a pop in the arm.  Patient had a PET scan done on Monday.  Showed a couple of lesions on her spine and her ribs.  Was told by her physician in [place] that they could treat this long-term.      Physical Exam   Triage Vital Signs: ED Triage Vitals  Enc Vitals Group     BP 10/31/22 1658 120/65     Pulse Rate 10/31/22 1658 65     Resp 10/31/22 1658 18     Temp 10/31/22 1658 97.7 F (36.5 C)     Temp Source 10/31/22 1658 Oral     SpO2 10/31/22 1658 95 %     Weight 10/31/22 1659 218 lb (98.9 kg)     Height 10/31/22 1659 5\' 7"  (1.702 m)     Head Circumference --      Peak Flow --      Pain Score 10/31/22 1658 10     Pain Loc --      Pain Edu? --      Excl. in [place]? --     Most recent vital signs: Vitals:   10/31/22 1658  BP: 120/65  Pulse: 65  Resp: 18  Temp: 97.7 F (36.5 C)  SpO2: 95%     General: Awake, no distress.   CV:  Good peripheral perfusion. regular rate and  rhythm Resp:  Normal effort. Lungs cta Abd:  No distention.   Other:  Left humerus is swollen and tender midshaft, patient has good movement of the lower arm, neurovascular intact   ED Results / Procedures / Treatments   Labs (all labs ordered are listed, but only abnormal results are displayed) Labs Reviewed - No data to display   EKG     RADIOLOGY X-ray of the left humerus    PROCEDURES:   .Ortho Injury Treatment  Date/Time: 10/31/2022 7:01 PM  Performed by: Versie Starks, PA-C Authorized by: Versie Starks, PA-C   Consent:    Consent  obtained:  Verbal   Consent given by:  Patient   Risks discussed:  Fracture, nerve damage, restricted joint movement, vascular damage and stiffness   Alternatives discussed:  ReferralInjury location: upper arm Location details: left upper arm Injury type: fracture Fracture type: humeral shaft Pre-procedure neurovascular assessment: neurovascularly intact Pre-procedure distal perfusion: normal Pre-procedure neurological function: normal Pre-procedure range of motion: normal  Anesthesia: Local anesthesia used: no  Patient sedated: NoManipulation performed: no Immobilization: splint Splint type: sugar tong Splint Applied by: ED Nurse Supplies used: cotton padding, elastic bandage and Ortho-Glass Post-procedure neurovascular assessment: post-procedure neurovascularly intact Post-procedure distal perfusion: normal Post-procedure neurological function: normal Post-procedure range of motion: normal Comments: The patient was then placed in a shoulder immobilizer.      MEDICATIONS ORDERED IN ED: Medications  oxyCODONE-acetaminophen (PERCOCET/ROXICET) 5-325 MG per tablet 1 tablet (1 tablet Oral Given 10/31/22 1805)  fentaNYL (SUBLIMAZE) injection 50 mcg (50 mcg Intramuscular Given 10/31/22 1854)  ondansetron (ZOFRAN-ODT) disintegrating tablet 4 mg (4 mg Oral Given 10/31/22 1854)  IMPRESSION / MDM / ASSESSMENT AND PLAN / ED COURSE  I reviewed the triage vital signs and the nursing notes.                              Differential diagnosis includes, but is not limited to, fracture, contusion, strain, metastatic disease  Patient's presentation is most consistent with acute complicated illness / injury requiring diagnostic workup.   X-ray of the left humerus was independently reviewed and interpreted by me as having a midshaft fracture, I do have concerns there is metastatic disease.  Confirmed by radiology  Consult orthopedics.  Patient was placed in a sling while we wait for him  to evaluate images.  Patient was given Percocet p.o.   Spoke with Dr. Sabra Heck from [facility].  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  Since the patient is going back to [place] it would be best to have her regular doctor and orthopedist in [place] to address the issue.  She will need radiation to this area.  Possible surgery.  Instructed [place] to put her in a splint and shoulder immobilizer.  Patient is continue to move her wrist and fingers.  I did explain all of this to the patient.  She is having a lot of pain even though she was given Percocet.  She takes Percocet 10/325 that she has from her doctor.  States that it is not controlling the rib pain.  Has not been taking the ibuprofen 800 mg.  We gave her fentanyl 50 mcg IM and Zofran 4 mg ODT prior to applying the splint.  Patient did tolerate the procedure well.  She is in agreement to follow-up with her doctor in [place] as she is going back in the next few days.  She was given a copy of the x-ray report and took pictures of the x-rays with her phone.  She is to return to the emergency department if worsening.  She was discharged in stable condition in the care of a family member.   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    Versie Starks, PA-C 10/31/22 Rosezella Rumpf, MD 10/31/22 [phone]

## 2022-10-31 NOTE — ED Triage Notes (Signed)
Patient presents with left arm pain. Reports she went to reach for something and felt a "pop"  Currently has breast cancer and suppose to start treatment Tuesday in [place]. Prescribed narcotics by cancer center

## 2022-10-31 NOTE — Discharge Instructions (Signed)
Follow-up with your regular doctor in [place].  Please let him know that you need a orthopedic surgeon to look at your arm.  This is a metastatic bone fracture.  This is a result of the breast cancer.  Take your pain medication that he is prescribed and also take ibuprofen.  Apply ice to the area that hurts.  Do not remove the splint until seen by your doctor and orthopedics.
